# Patient Record
Sex: Female | Born: 1945 | Race: White | Hispanic: No | Smoking: Current every day smoker
Health system: Southern US, Community
[De-identification: ages and names within clinical notes are randomized; demographics above are authoritative.]

## PROBLEM LIST (undated history)

## (undated) DIAGNOSIS — F419 Anxiety disorder, unspecified: Secondary | ICD-10-CM

## (undated) DIAGNOSIS — M25551 Pain in right hip: Secondary | ICD-10-CM

## (undated) DIAGNOSIS — M1990 Unspecified osteoarthritis, unspecified site: Secondary | ICD-10-CM

## (undated) DIAGNOSIS — K279 Peptic ulcer, site unspecified, unspecified as acute or chronic, without hemorrhage or perforation: Secondary | ICD-10-CM

## (undated) DIAGNOSIS — R0902 Hypoxemia: Secondary | ICD-10-CM

## (undated) DIAGNOSIS — M25539 Pain in unspecified wrist: Secondary | ICD-10-CM

## (undated) DIAGNOSIS — IMO0002 Reserved for concepts with insufficient information to code with codable children: Secondary | ICD-10-CM

## (undated) DIAGNOSIS — M25552 Pain in left hip: Secondary | ICD-10-CM

## (undated) DIAGNOSIS — I10 Essential (primary) hypertension: Secondary | ICD-10-CM

## (undated) DIAGNOSIS — J449 Chronic obstructive pulmonary disease, unspecified: Secondary | ICD-10-CM

## (undated) DIAGNOSIS — G894 Chronic pain syndrome: Secondary | ICD-10-CM

## (undated) DIAGNOSIS — Z9981 Dependence on supplemental oxygen: Secondary | ICD-10-CM

## (undated) DIAGNOSIS — M549 Dorsalgia, unspecified: Secondary | ICD-10-CM

## (undated) DIAGNOSIS — M810 Age-related osteoporosis without current pathological fracture: Secondary | ICD-10-CM

---

## 2006-05-30 ENCOUNTER — Emergency Department (HOSPITAL_COMMUNITY): Admission: EM | Admit: 2006-05-30 | Discharge: 2006-05-30 | Payer: Self-pay | Admitting: Emergency Medicine

## 2006-06-07 ENCOUNTER — Emergency Department (HOSPITAL_COMMUNITY): Admission: EM | Admit: 2006-06-07 | Discharge: 2006-06-07 | Payer: Self-pay | Admitting: Emergency Medicine

## 2007-02-12 ENCOUNTER — Emergency Department (HOSPITAL_COMMUNITY): Admission: EM | Admit: 2007-02-12 | Discharge: 2007-02-12 | Payer: Self-pay | Admitting: Emergency Medicine

## 2007-03-18 ENCOUNTER — Ambulatory Visit: Payer: Self-pay | Admitting: Gastroenterology

## 2009-04-13 ENCOUNTER — Encounter: Payer: Self-pay | Admitting: Family Medicine

## 2010-06-30 ENCOUNTER — Encounter: Payer: Self-pay | Admitting: Family Medicine

## 2010-10-23 NOTE — Consult Note (Signed)
Brittney West, Brittney West             ACCOUNT NO.:  [id number]   MEDICAL RECORD NO.:  [id number]          PATIENT TYPE:  AMB   LOCATION:  DAY                           FACILITY:  [facility]   PHYSICIAN:  Kassie Mends, M.D.      DATE OF BIRTH:  05/14/1946   DATE OF CONSULTATION:  03/18/2007  DATE OF DISCHARGE:                                 CONSULTATION   REASON FOR CONSULTATION:  Hemoccult positive stool, GERD.   HISTORY OF PRESENT ILLNESS:  The patient is a 65 year old female patient  of Dr. Malvin Johns who presents for further evaluation of Hemoccult  positive stools and GERD.  The patient has been having some right upper  quadrant abdominal discomfort associated with heartburn and intermittent  nausea, vomiting.  Brittney West has been to the emergency department twice in  September for these symptoms as well as some back pain issues.  Brittney West had  an abdominal ultrasound which revealed a small gallbladder polyp.  Her  white count was elevated at 11,200, hemoglobin 16.1.  Brittney West is a smoker.  Platelet count 243,000.  LFTs were normal.  Her lipase was normal as  well.  Brittney West gives a history of bleeding peptic ulcer disease in the  remote past.  It does not sound like Brittney West has ever had an endoscopy,  however.  Brittney West is on Nexium 40 mg daily.  Brittney West has been taking Goody's  powders 2 times a day for arthritic pain.  Brittney West has since stopped this at  Dr. Daisy Blossom request.  Brittney West denies any melena or bright red blood per  rectum, dysphagia, odynophagia.  Brittney West has chronic constipation.  Brittney West has  typical GERD symptoms.  Brittney West has an intentional weight loss of over 50  pounds chronically that Brittney West has done with dietary changes.  Brittney West has  never had a colonoscopy.   CURRENT MEDICATIONS:  1. Nexium 40 mg daily.  2. OTC sinus medications.  3. Goody's powders as above but recently discontinued.   ALLERGIES:  1. STADOL.  2. TORADOL.  3. CODEINE IN LARGE AMOUNTS.  4. TYLENOL IN LARGE AMOUNTS.  5. Brittney West STATES Brittney West HAS HAD DIFFICULTY  WITH ANESTHESIA IN THE PAST,      STATING THAT Brittney West HAS NOT HAD ADEQUATE SEDATION, BUT Brittney West CANNOT      RECALL ANY PARTICULAR MEDICATIONS Brittney West HAS RECEIVED.  HER MOST      RECENT SURGERY ON HER RIGHT HAND Brittney West DID FINE, HOWEVER.   PAST MEDICAL HISTORY:  1. Asthma.  2. GERD.  3. Osteoporosis.  4. Osteoarthritis.  5. Rheumatoid arthritis.  6. Degenerative disc disease.  7. History of gallbladder polyp.   PAST SURGICAL HISTORY:  1. Brittney West had a hernia repair as a child.  2. Partial hysterectomy in 1978.  3. Left-hand carpal tunnel release in 1995.  4. 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  5. Brittney West had surgery on her right-hand finger twice and surgery on her      left foot.   FAMILY HISTORY:  Mother deceased at age 9 with suicide.  Father  deceased at age 39 of MI.  No  family history of colorectal cancer.  Brittney West  lost a daughter to a house fire at age 51 and another daughter died in  her sleep at age 54 apparently from an undiagnosed cardiac abnormality.   SOCIAL HISTORY:  Brittney West is married.  Brittney West has 1 son and 2 deceased  daughters.  Brittney West is on disability. Brittney West is a smoker.  Smokes 1 pack of  cigarettes daily.  No alcohol use.   REVIEW OF SYSTEMS:  GI:  Brittney West HPI for GI.  CONSTITUTIONAL:  See HPI.  CARDIOPULMONARY:  Brittney West has intermittent congestion and cough.  Denies  chest pain, palpitations.   PHYSICAL EXAMINATION:  VITAL SIGNS: Weight 127, height 5 feet 4 inches,  temp 98.5, blood pressure 112/78, pulse 80.  GENERAL:  Pleasant, thin female in no acute distress.  SKIN:  Warm and dry.  No jaundice.  HEENT: Sclerae nonicteric.  Oropharyngeal mucosa moist and pink.  No  lesions, erythema or exudate.  NECK:  No lymphadenopathy, thyromegaly.  CHEST:  Lungs clear to auscultation.  CARDIAC: Exam reveals regular rate and rhythm.  Normal S1, S2.  No  murmurs, rubs or gallops.  ABDOMEN:  Positive bowel sounds.  Abdomen is soft, nondistended,  nontender.  No organomegaly or masses.  No rebound  tenderness or  guarding.  No abdominal bruits or hernias.  EXTREMITIES:  No edema.   IMPRESSION:  Brittney West is a 65 year old lady with right upper quadrant  abdominal discomfort and recently diagnosed with a gallbladder polyp.  Brittney West also has typical GERD symptoms and chronic constipation and recently  stool was Hemoccult positive on digital rectal exam by Dr. Malvin Johns.  Brittney West has a history of chronic aspirin use as outlined above.  Hemoccult  positive stools may be secondary to gastrointestinal erosion or  irritation from nonsteroidal anti-inflammatories/aspirin.  However, we  need to exclude erosive reflux esophagitis, peptic ulcer disease or  colorectal polyp.   PLAN:  EGD and colonoscopy with Dr. Cira Servant in the near future.  The  patient has concerns regarding obtaining adequate sedation.  Brittney West is not  on any psychotropic medications.  Brittney West does not recall having any  specific allergies to anesthesia.   I would like to thank Dr. Malvin Johns for allowing [place] to take part in the  care of this patient.      Tana Coast, P.A.      Kassie Mends, M.D.  Electronically Signed    LL/MEDQ  D:  03/18/2007  T:  03/19/2007  Job:  [id number]   cc:   Barbaraann Barthel, M.D.  Fax: [phone]

## 2013-05-24 ENCOUNTER — Emergency Department (HOSPITAL_COMMUNITY): Payer: Medicare Other

## 2013-05-24 ENCOUNTER — Inpatient Hospital Stay (HOSPITAL_COMMUNITY)
Admission: EM | Admit: 2013-05-24 | Discharge: 2013-05-31 | DRG: 208 | Disposition: A | Discharge status: Skilled Nursing Facility | Payer: Medicare Other | Attending: Internal Medicine | Admitting: Internal Medicine

## 2013-05-24 ENCOUNTER — Encounter (HOSPITAL_COMMUNITY): Payer: Self-pay | Admitting: Emergency Medicine

## 2013-05-24 DIAGNOSIS — T502X5A Adverse effect of carbonic-anhydrase inhibitors, benzothiadiazides and other diuretics, initial encounter: Secondary | ICD-10-CM | POA: Diagnosis present

## 2013-05-24 DIAGNOSIS — F17200 Nicotine dependence, unspecified, uncomplicated: Secondary | ICD-10-CM | POA: Diagnosis present

## 2013-05-24 DIAGNOSIS — R0609 Other forms of dyspnea: Secondary | ICD-10-CM

## 2013-05-24 DIAGNOSIS — J1100 Influenza due to unidentified influenza virus with unspecified type of pneumonia: Secondary | ICD-10-CM | POA: Diagnosis present

## 2013-05-24 DIAGNOSIS — R0600 Dyspnea, unspecified: Secondary | ICD-10-CM | POA: Diagnosis present

## 2013-05-24 DIAGNOSIS — F411 Generalized anxiety disorder: Secondary | ICD-10-CM | POA: Diagnosis present

## 2013-05-24 DIAGNOSIS — I5031 Acute diastolic (congestive) heart failure: Secondary | ICD-10-CM | POA: Diagnosis present

## 2013-05-24 DIAGNOSIS — Z9981 Dependence on supplemental oxygen: Secondary | ICD-10-CM

## 2013-05-24 DIAGNOSIS — J441 Chronic obstructive pulmonary disease with (acute) exacerbation: Secondary | ICD-10-CM | POA: Diagnosis present

## 2013-05-24 DIAGNOSIS — J189 Pneumonia, unspecified organism: Secondary | ICD-10-CM | POA: Diagnosis present

## 2013-05-24 DIAGNOSIS — E872 Acidosis, unspecified: Secondary | ICD-10-CM | POA: Diagnosis present

## 2013-05-24 DIAGNOSIS — J9620 Acute and chronic respiratory failure, unspecified whether with hypoxia or hypercapnia: Principal | ICD-10-CM | POA: Diagnosis present

## 2013-05-24 DIAGNOSIS — G9340 Encephalopathy, unspecified: Secondary | ICD-10-CM | POA: Diagnosis present

## 2013-05-24 DIAGNOSIS — IMO0002 Reserved for concepts with insufficient information to code with codable children: Secondary | ICD-10-CM | POA: Diagnosis present

## 2013-05-24 DIAGNOSIS — I10 Essential (primary) hypertension: Secondary | ICD-10-CM | POA: Diagnosis present

## 2013-05-24 DIAGNOSIS — I129 Hypertensive chronic kidney disease with stage 1 through stage 4 chronic kidney disease, or unspecified chronic kidney disease: Secondary | ICD-10-CM | POA: Diagnosis present

## 2013-05-24 DIAGNOSIS — R197 Diarrhea, unspecified: Secondary | ICD-10-CM | POA: Diagnosis present

## 2013-05-24 DIAGNOSIS — J449 Chronic obstructive pulmonary disease, unspecified: Secondary | ICD-10-CM | POA: Diagnosis present

## 2013-05-24 DIAGNOSIS — I509 Heart failure, unspecified: Secondary | ICD-10-CM | POA: Diagnosis present

## 2013-05-24 DIAGNOSIS — M1990 Unspecified osteoarthritis, unspecified site: Secondary | ICD-10-CM | POA: Diagnosis present

## 2013-05-24 DIAGNOSIS — F419 Anxiety disorder, unspecified: Secondary | ICD-10-CM

## 2013-05-24 DIAGNOSIS — Z79899 Other long term (current) drug therapy: Secondary | ICD-10-CM

## 2013-05-24 DIAGNOSIS — M810 Age-related osteoporosis without current pathological fracture: Secondary | ICD-10-CM | POA: Diagnosis present

## 2013-05-24 DIAGNOSIS — Z23 Encounter for immunization: Secondary | ICD-10-CM

## 2013-05-24 DIAGNOSIS — G894 Chronic pain syndrome: Secondary | ICD-10-CM | POA: Diagnosis present

## 2013-05-24 DIAGNOSIS — N189 Chronic kidney disease, unspecified: Secondary | ICD-10-CM | POA: Diagnosis present

## 2013-05-24 DIAGNOSIS — N390 Urinary tract infection, site not specified: Secondary | ICD-10-CM | POA: Diagnosis present

## 2013-05-24 DIAGNOSIS — R1110 Vomiting, unspecified: Secondary | ICD-10-CM | POA: Diagnosis present

## 2013-05-24 HISTORY — DX: Pain in unspecified wrist: M25.539

## 2013-05-24 HISTORY — DX: Dependence on supplemental oxygen: Z99.81

## 2013-05-24 HISTORY — DX: Age-related osteoporosis without current pathological fracture: M81.0

## 2013-05-24 HISTORY — DX: Unspecified osteoarthritis, unspecified site: M19.90

## 2013-05-24 HISTORY — DX: Hypoxemia: R09.02

## 2013-05-24 HISTORY — DX: Dorsalgia, unspecified: M54.9

## 2013-05-24 HISTORY — DX: Peptic ulcer, site unspecified, unspecified as acute or chronic, without hemorrhage or perforation: K27.9

## 2013-05-24 HISTORY — DX: Pain in right hip: M25.551

## 2013-05-24 HISTORY — DX: Essential (primary) hypertension: I10

## 2013-05-24 HISTORY — DX: Pain in left hip: M25.552

## 2013-05-24 HISTORY — DX: Anxiety disorder, unspecified: F41.9

## 2013-05-24 HISTORY — DX: Chronic pain syndrome: G89.4

## 2013-05-24 HISTORY — DX: Chronic obstructive pulmonary disease, unspecified: J44.9

## 2013-05-24 HISTORY — DX: Reserved for concepts with insufficient information to code with codable children: IMO0002

## 2013-05-24 LAB — URINALYSIS, ROUTINE W REFLEX MICROSCOPIC
Bilirubin Urine: NEGATIVE
Ketones, ur: 15 mg/dL — AB
Leukocytes, UA: NEGATIVE
Nitrite: POSITIVE — AB
Urobilinogen, UA: 0.2 mg/dL (ref 0.0–1.0)
pH: 6 (ref 5.0–8.0)

## 2013-05-24 LAB — TROPONIN I
Troponin I: 0.43 ng/mL (ref <0.30)
Troponin I: <0.3 ng/mL (ref <0.30)

## 2013-05-24 LAB — BLOOD GAS, ARTERIAL
Acid-Base Excess: 1.8 mmol/L (ref 0.0–2.0)
Bicarbonate: 26.4 mEq/L — ABNORMAL HIGH (ref 20.0–24.0)
Bicarbonate: 26.5 mEq/L — ABNORMAL HIGH (ref 20.0–24.0)
MECHVT: 500 mL
Patient temperature: 37
TCO2: 25.2 mmol/L (ref 0–100)
TCO2: 23.9 mmol/L (ref 0–100)
pCO2 arterial: 77.5 mmHg (ref 35.0–45.0)
pCO2 arterial: 46.9 mmHg — ABNORMAL HIGH (ref 35.0–45.0)
pH, Arterial: 7.159 — CL (ref 7.350–7.450)
pH, Arterial: 7.371 (ref 7.350–7.450)
pO2, Arterial: 167 mmHg — ABNORMAL HIGH (ref 80.0–100.0)

## 2013-05-24 LAB — INFLUENZA PANEL BY PCR (TYPE A & B)
H1N1 flu by pcr: NOT DETECTED
Influenza A By PCR: NEGATIVE
Influenza B By PCR: NEGATIVE

## 2013-05-24 LAB — BASIC METABOLIC PANEL
CO2: 29 mEq/L (ref 19–32)
Calcium: 9.6 mg/dL (ref 8.4–10.5)
Chloride: 93 mEq/L — ABNORMAL LOW (ref 96–112)
Creatinine, Ser: 0.77 mg/dL (ref 0.50–1.10)
GFR calc Af Amer: >90 mL/min (ref >90)
Glucose, Bld: 140 mg/dL — ABNORMAL HIGH (ref 70–99)
Sodium: 137 mEq/L (ref 135–145)

## 2013-05-24 LAB — LACTIC ACID, PLASMA: Lactic Acid, Venous: 1.4 mmol/L (ref 0.5–2.2)

## 2013-05-24 LAB — PRO B NATRIURETIC PEPTIDE: Pro B Natriuretic peptide (BNP): 5934 pg/mL — ABNORMAL HIGH (ref 0–125)

## 2013-05-24 LAB — RAPID URINE DRUG SCREEN, HOSP PERFORMED
Amphetamines: NOT DETECTED
Benzodiazepines: POSITIVE — AB
Opiates: NOT DETECTED

## 2013-05-24 LAB — URINE MICROSCOPIC-ADD ON

## 2013-05-24 LAB — CBC
MCV: 94.7 fL (ref 78.0–100.0)
Platelets: 292 10*3/uL (ref 150–400)
RBC: 4.38 MIL/uL (ref 3.87–5.11)
WBC: 14 10*3/uL — ABNORMAL HIGH (ref 4.0–10.5)

## 2013-05-24 LAB — GLUCOSE, CAPILLARY: Glucose-Capillary: 133 mg/dL — ABNORMAL HIGH (ref 70–99)

## 2013-05-24 LAB — HEPATIC FUNCTION PANEL
ALT: 15 U/L (ref 0–35)
AST: 23 U/L (ref 0–37)
Indirect Bilirubin: 0.2 mg/dL — ABNORMAL LOW (ref 0.3–0.9)
Total Protein: 8 g/dL (ref 6.0–8.3)

## 2013-05-24 LAB — ETHANOL: Alcohol, Ethyl (B): <11 mg/dL (ref 0–11)

## 2013-05-24 MED ORDER — VANCOMYCIN HCL IN DEXTROSE 1-5 GM/200ML-% IV SOLN
1000.0000 mg | Freq: Once | INTRAVENOUS | Status: AC
  Administered 2013-05-24: 1000 mg via INTRAVENOUS
  Filled 2013-05-24: qty 200

## 2013-05-24 MED ORDER — PIPERACILLIN-TAZOBACTAM 3.375 G IVPB
INTRAVENOUS | Status: AC
  Filled 2013-05-24: qty 50

## 2013-05-24 MED ORDER — SODIUM CHLORIDE 0.9 % IV SOLN: INTRAVENOUS | Status: DC

## 2013-05-24 MED ORDER — FENTANYL CITRATE 0.05 MG/ML IJ SOLN: 50.0000 ug | INTRAMUSCULAR | Status: DC | PRN

## 2013-05-24 MED ORDER — SODIUM CHLORIDE 0.9 % IV SOLN
0.0000 ug/h | INTRAVENOUS | Status: DC
  Administered 2013-05-24: 50 ug/h via INTRAVENOUS
  Filled 2013-05-24 (×2): qty 50

## 2013-05-24 MED ORDER — CHLORHEXIDINE GLUCONATE 0.12 % MT SOLN
15.0000 mL | Freq: Two times a day (BID) | OROMUCOSAL | Status: DC
  Administered 2013-05-24 – 2013-05-29 (×9): 15 mL via OROMUCOSAL
  Filled 2013-05-24 (×14): qty 15

## 2013-05-24 MED ORDER — BIOTENE DRY MOUTH MT LIQD: 15.0000 mL | Freq: Two times a day (BID) | OROMUCOSAL | Status: DC

## 2013-05-24 MED ORDER — BIOTENE DRY MOUTH MT LIQD
15.0000 mL | Freq: Four times a day (QID) | OROMUCOSAL | Status: DC
  Administered 2013-05-25 – 2013-05-30 (×16): 15 mL via OROMUCOSAL

## 2013-05-24 MED ORDER — METOPROLOL SUCCINATE ER 50 MG PO TB24: 50.0000 mg | ORAL_TABLET | Freq: Every day | ORAL | Status: DC

## 2013-05-24 MED ORDER — PANTOPRAZOLE SODIUM 40 MG IV SOLR
40.0000 mg | Freq: Every day | INTRAVENOUS | Status: DC
  Administered 2013-05-24 – 2013-05-27 (×4): 40 mg via INTRAVENOUS
  Filled 2013-05-24 (×4): qty 40

## 2013-05-24 MED ORDER — VANCOMYCIN HCL IN DEXTROSE 1-5 GM/200ML-% IV SOLN
1000.0000 mg | INTRAVENOUS | Status: DC
  Administered 2013-05-25 – 2013-05-27 (×3): 1000 mg via INTRAVENOUS
  Filled 2013-05-24 (×4): qty 200

## 2013-05-24 MED ORDER — ALBUTEROL SULFATE (5 MG/ML) 0.5% IN NEBU
5.0000 mg | INHALATION_SOLUTION | Freq: Once | RESPIRATORY_TRACT | Status: AC
  Administered 2013-05-24: 5 mg via RESPIRATORY_TRACT

## 2013-05-24 MED ORDER — SODIUM CHLORIDE 0.9 % IV SOLN
INTRAVENOUS | Status: AC
  Administered 2013-05-24: 650 mL via INTRAVENOUS

## 2013-05-24 MED ORDER — CHLORHEXIDINE GLUCONATE 0.12 % MT SOLN: 15.0000 mL | Freq: Two times a day (BID) | OROMUCOSAL | Status: DC

## 2013-05-24 MED ORDER — BIOTENE DRY MOUTH MT LIQD
15.0000 mL | Freq: Four times a day (QID) | OROMUCOSAL | Status: DC
  Administered 2013-05-25 – 2013-05-26 (×3): 15 mL via OROMUCOSAL

## 2013-05-24 MED ORDER — SODIUM CHLORIDE 0.9 % IV SOLN
10.0000 ug/h | INTRAVENOUS | Status: DC
  Filled 2013-05-24: qty 50

## 2013-05-24 MED ORDER — VANCOMYCIN HCL IN DEXTROSE 1-5 GM/200ML-% IV SOLN
INTRAVENOUS | Status: AC
  Filled 2013-05-24: qty 200

## 2013-05-24 MED ORDER — PIPERACILLIN-TAZOBACTAM 3.375 G IVPB
3.3750 g | Freq: Three times a day (TID) | INTRAVENOUS | Status: DC
  Administered 2013-05-25 – 2013-05-28 (×11): 3.375 g via INTRAVENOUS
  Filled 2013-05-24 (×11): qty 50

## 2013-05-24 MED ORDER — FENTANYL CITRATE 0.05 MG/ML IJ SOLN: 50.0000 ug | Freq: Once | INTRAMUSCULAR | Status: DC

## 2013-05-24 MED ORDER — ONDANSETRON HCL 4 MG/2ML IJ SOLN
4.0000 mg | Freq: Once | INTRAMUSCULAR | Status: AC
  Administered 2013-05-24: 4 mg via INTRAVENOUS
  Filled 2013-05-24: qty 2

## 2013-05-24 MED ORDER — PANTOPRAZOLE SODIUM 40 MG IV SOLR: 40.0000 mg | Freq: Every day | INTRAVENOUS | Status: DC

## 2013-05-24 MED ORDER — FENTANYL CITRATE 0.05 MG/ML IJ SOLN
INTRAMUSCULAR | Status: AC
  Filled 2013-05-24: qty 50

## 2013-05-24 MED ORDER — ROCURONIUM BROMIDE 50 MG/5ML IV SOLN
1.0000 mg/kg | Freq: Once | INTRAVENOUS | Status: AC
  Administered 2013-05-24: 57.6 mg via INTRAVENOUS
  Filled 2013-05-24: qty 5.76

## 2013-05-24 MED ORDER — SODIUM CHLORIDE 0.9 % IV SOLN
1000.0000 mL | Freq: Once | INTRAVENOUS | Status: AC
  Administered 2013-05-24: 1000 mL via INTRAVENOUS

## 2013-05-24 MED ORDER — PIPERACILLIN-TAZOBACTAM 3.375 G IVPB
3.3750 g | Freq: Once | INTRAVENOUS | Status: AC
  Administered 2013-05-24: 3.375 g via INTRAVENOUS
  Filled 2013-05-24: qty 50

## 2013-05-24 MED ORDER — IPRATROPIUM BROMIDE 0.02 % IN SOLN
0.5000 mg | Freq: Once | RESPIRATORY_TRACT | Status: AC
  Administered 2013-05-24: 0.5 mg via RESPIRATORY_TRACT

## 2013-05-24 MED ORDER — PROPOFOL 10 MG/ML IV EMUL
5.0000 ug/kg/min | Freq: Once | INTRAVENOUS | Status: AC
  Administered 2013-05-24: 5 ug/kg/min via INTRAVENOUS
  Filled 2013-05-24: qty 100

## 2013-05-24 MED ORDER — ENOXAPARIN SODIUM 40 MG/0.4ML ~~LOC~~ SOLN
40.0000 mg | SUBCUTANEOUS | Status: DC
Start: 2013-05-24 — End: 2013-05-31
  Administered 2013-05-24 – 2013-05-30 (×7): 40 mg via SUBCUTANEOUS
  Filled 2013-05-24 (×7): qty 0.4

## 2013-05-24 MED ORDER — ALBUTEROL SULFATE (5 MG/ML) 0.5% IN NEBU
2.5000 mg | INHALATION_SOLUTION | Freq: Four times a day (QID) | RESPIRATORY_TRACT | Status: DC
  Administered 2013-05-24 – 2013-05-26 (×8): 2.5 mg via RESPIRATORY_TRACT
  Filled 2013-05-24 (×8): qty 0.5

## 2013-05-24 MED ORDER — ETOMIDATE 2 MG/ML IV SOLN
0.3000 mg/kg | Freq: Once | INTRAVENOUS | Status: AC
  Administered 2013-05-24: 17.28 mg via INTRAVENOUS

## 2013-05-24 MED ORDER — IPRATROPIUM BROMIDE 0.02 % IN SOLN
0.5000 mg | Freq: Four times a day (QID) | RESPIRATORY_TRACT | Status: DC
  Administered 2013-05-24 – 2013-05-26 (×8): 0.5 mg via RESPIRATORY_TRACT
  Filled 2013-05-24 (×8): qty 2.5

## 2013-05-24 MED ORDER — FENTANYL BOLUS VIA INFUSION
25.0000 ug | INTRAVENOUS | Status: DC | PRN
  Filled 2013-05-24: qty 50

## 2013-05-24 NOTE — ED Notes (Signed)
Lab called gave critical Troponin 0.43.  Notified Dr Rubin Payor.

## 2013-05-24 NOTE — ED Notes (Signed)
Pt with cough, gen. Weakness, emesis

## 2013-05-24 NOTE — Plan of Care (Signed)
Problem: Consults Goal: Diabetes Guidelines if Diabetic/Glucose > 140 If diabetic or lab glucose is > 140 mg/dl - Initiate Diabetes/Hyperglycemia Guidelines & Document Interventions  Outcome: Not Applicable Date Met:  05/24/13 Blood sugar 100 on admission to floor  Problem: Phase I Progression Outcomes Goal: VTE prophylaxis Outcome: Completed/Met Date Met:  05/24/13 Lovenox Goal: GIProphysixis Outcome: Completed/Met Date Met:  05/24/13 protonix Goal: Oral Care per Protocol Outcome: Completed/Met Date Met:  05/24/13 Q4 hrs Goal: Pneumonia/flu vaccination screen completed Outcome: Progressing No family present to complete at present Goal: Code status addressed with pt/family Outcome: Progressing Patient is a full code Goal: Pain controlled with appropriate interventions Outcome: Progressing Fentanyl gtt for sedation and chronic pain issues Goal: Initial discharge plan identified Outcome: Progressing Lives with cousin at present

## 2013-05-24 NOTE — ED Provider Notes (Signed)
CSN: [id number]     Arrival date & time 05/24/13  1156 History   This chart was scribed for [employer]. Rubin Payor, MD, by Yevette Edwards, ED Scribe. This patient was seen in room [facility] and the patient's care was started at 1:18 PM. First MD Initiated Contact with Patient 05/24/13 1304     Chief Complaint  Patient presents with  . Emesis  . Weakness  . Cough  . Respiratory Distress   Level Five Caveat: AMS  The history is provided by the patient and medical records. The history is limited by the condition of the patient. No language interpreter was used.    HPI Comments: Brittney West is a 67 y.o. female who presents to the Emergency Department complaining of generalized weakness which began last week. She has experienced emesis, diarrhea, and a cough.   Per nursing staff, the pt has a h/o COPD and is dependent upon oxygen she does not know the L/min rate.   Past Medical History  Diagnosis Date  . Hypertension   . COPD (chronic obstructive pulmonary disease)   . Oxygen dependent   . Hypoxia   . Peptic ulcer   . DDD (degenerative disc disease)   . Osteoporosis   . Osteoarthritis   . Chronic pain syndrome   . Back pain   . Hip pain, bilateral   . Wrist pain   . Anxiety    History reviewed. No pertinent past surgical history. History reviewed. No pertinent family history. History  Substance Use Topics  . Smoking status: Current Every Day Smoker    Types: Cigarettes  . Smokeless tobacco: Not on file  . Alcohol Use: No   No OB history provided.  Review of Systems  Unable to perform ROS: Mental status change    Allergies  Advair diskus; Asa; Codeine; Nsaids; Other; Stadol; and Xanax  Home Medications  No current outpatient prescriptions on file.  Triage Vitals: BP 96/67  Pulse 116  Temp(Src) 99.6 F (37.6 C) (Oral)  Resp 20  SpO2 91%  Physical Exam  Nursing note and vitals reviewed. Constitutional:  Somewhat pale.   HENT:  Head: Normocephalic and  atraumatic.  Eyes: EOM are normal.  Pupils are 3 mm and somewhat constricted.   Neck: Neck supple. No tracheal deviation present.  Cardiovascular: Normal rate, regular rhythm, normal heart sounds and intact distal pulses.   No murmur heard. Pulmonary/Chest: Effort normal. No respiratory distress. She has rales.  Diffuse rales bilaterally.  Abdominal: There is tenderness.  Diffusely tender.   Musculoskeletal: Normal range of motion. She exhibits no edema.  No peripheral edema.  Neurological: She is alert.  Skin: Skin is warm and dry. She is not diaphoretic.  Psychiatric:  Responds occasionally, but mainly moans.     ED Course  Procedures (including critical care time)   COORDINATION OF CARE:  1:25 PM- Discussed treatment plan with patient, and the patient agreed to the plan.   1:34 PM- Rechecked pt whose stats had dropped to 30%.   1:52 PM- Rechecked pt. Stats improved.   Results for orders placed during the hospital encounter of 05/24/13  MRSA PCR SCREENING      Result Value Range   MRSA by PCR NEGATIVE  NEGATIVE  CULTURE, BLOOD (ROUTINE X 2)      Result Value Range   Specimen Description BLOOD RIGHT ARM     Special Requests BOTTLES DRAWN AEROBIC AND ANAEROBIC 6CC BOTTLES     Culture PENDING  Report Status PENDING    CULTURE, BLOOD (ROUTINE X 2)      Result Value Range   Specimen Description BLOOD RIGHT HAND     Special Requests BOTTLES DRAWN AEROBIC AND ANAEROBIC 6CC BOTTLES     Culture PENDING     Report Status PENDING    CBC      Result Value Range   WBC 14.0 (*) 4.0 - 10.5 K/uL   RBC 4.38  3.87 - 5.11 MIL/uL   Hemoglobin 13.7  12.0 - 15.0 g/dL   HCT 78.2  95.6 - 21.3 %   MCV 94.7  78.0 - 100.0 fL   MCH 31.3  26.0 - 34.0 pg   MCHC 33.0  30.0 - 36.0 g/dL   RDW 08.6  57.8 - 46.9 %   Platelets 292  150 - 400 K/uL  BASIC METABOLIC PANEL      Result Value Range   Sodium 137  135 - 145 mEq/L   Potassium 4.2  3.5 - 5.1 mEq/L   Chloride 93 (*) 96 - 112 mEq/L    CO2 29  19 - 32 mEq/L   Glucose, Bld 140 (*) 70 - 99 mg/dL   BUN 21  6 - 23 mg/dL   Creatinine, Ser 6.29  0.50 - 1.10 mg/dL   Calcium 9.6  8.4 - 52.8 mg/dL   GFR calc non Af Amer 86 (*) >90 mL/min   GFR calc Af Amer >90  >90 mL/min  GLUCOSE, CAPILLARY      Result Value Range   Glucose-Capillary 133 (*) 70 - 99 mg/dL  HEPATIC FUNCTION PANEL      Result Value Range   Total Protein 8.0  6.0 - 8.3 g/dL   Albumin 3.4 (*) 3.5 - 5.2 g/dL   AST 23  0 - 37 U/L   ALT 15  0 - 35 U/L   Alkaline Phosphatase 166 (*) 39 - 117 U/L   Total Bilirubin 0.3  0.3 - 1.2 mg/dL   Bilirubin, Direct 0.1  0.0 - 0.3 mg/dL   Indirect Bilirubin 0.2 (*) 0.3 - 0.9 mg/dL  LIPASE, BLOOD      Result Value Range   Lipase 16  11 - 59 U/L  LACTIC ACID, PLASMA      Result Value Range   Lactic Acid, Venous 1.4  0.5 - 2.2 mmol/L  ETHANOL      Result Value Range   Alcohol, Ethyl (B) <11  0 - 11 mg/dL  URINE RAPID DRUG SCREEN (HOSP PERFORMED)      Result Value Range   Opiates NONE DETECTED  NONE DETECTED   Cocaine NONE DETECTED  NONE DETECTED   Benzodiazepines POSITIVE (*) NONE DETECTED   Amphetamines NONE DETECTED  NONE DETECTED   Tetrahydrocannabinol NONE DETECTED  NONE DETECTED   Barbiturates NONE DETECTED  NONE DETECTED  TROPONIN I      Result Value Range   Troponin I 0.43 (*) <0.30 ng/mL  PRO B NATRIURETIC PEPTIDE      Result Value Range   Pro B Natriuretic peptide (BNP) 5934.0 (*) 0 - 125 pg/mL  BLOOD GAS, ARTERIAL      Result Value Range   FIO2 80.00     Delivery systems NON-REBREATHER OXYGEN MASK     pH, Arterial 7.159 (*) 7.350 - 7.450   pCO2 arterial 77.5 (*) 35.0 - 45.0 mmHg   pO2, Arterial 162.0 (*) 80.0 - 100.0 mmHg   Bicarbonate 26.4 (*) 20.0 - 24.0 mEq/L  TCO2 25.2  0 - 100 mmol/L   Acid-base deficit 1.4  0.0 - 2.0 mmol/L   O2 Saturation 97.8     Patient temperature 37.0     Collection site RIGHT RADIAL     Drawn by [id number]     Sample type ARTERIAL     Allens test (pass/fail) PASS   PASS  URINALYSIS, ROUTINE W REFLEX MICROSCOPIC      Result Value Range   Color, Urine YELLOW  YELLOW   APPearance CLOUDY (*) CLEAR   Specific Gravity, Urine >1.030 (*) 1.005 - 1.030   pH 6.0  5.0 - 8.0   Glucose, UA NEGATIVE  NEGATIVE mg/dL   Hgb urine dipstick LARGE (*) NEGATIVE   Bilirubin Urine NEGATIVE  NEGATIVE   Ketones, ur 15 (*) NEGATIVE mg/dL   Protein, ur 147 (*) NEGATIVE mg/dL   Urobilinogen, UA 0.2  0.0 - 1.0 mg/dL   Nitrite POSITIVE (*) NEGATIVE   Leukocytes, UA NEGATIVE  NEGATIVE  TROPONIN I      Result Value Range   Troponin I <0.30  <0.30 ng/mL  BLOOD GAS, ARTERIAL      Result Value Range   FIO2 60.00     Delivery systems VENTILATOR     Mode PRESSURE REGULATED VOLUME CONTROL     VT 500     Rate 15     Peep/cpap 5.0     pH, Arterial 7.371  7.350 - 7.450   pCO2 arterial 46.9 (*) 35.0 - 45.0 mmHg   pO2, Arterial 167.0 (*) 80.0 - 100.0 mmHg   Bicarbonate 26.5 (*) 20.0 - 24.0 mEq/L   TCO2 23.9  0 - 100 mmol/L   Acid-Base Excess 1.8  0.0 - 2.0 mmol/L   O2 Saturation 98.8     Patient temperature 37.0     Collection site RIGHT BRACHIAL     Drawn by [id number]     Sample type ARTERIAL     Allens test (pass/fail) PASS  PASS  URINE MICROSCOPIC-ADD ON      Result Value Range   WBC, UA 0-2  <3 WBC/hpf   RBC / HPF 7-10  <3 RBC/hpf   Bacteria, UA MANY (*) RARE   Casts GRANULAR CAST (*) NEGATIVE  TROPONIN I      Result Value Range   Troponin I <0.30  <0.30 ng/mL  GLUCOSE, CAPILLARY      Result Value Range   Glucose-Capillary 100 (*) 70 - 99 mg/dL   Comment 1 Notify RN     Comment 2 Documented in Chart     Dg Chest Port 1 View  05/24/2013   CLINICAL DATA:  Altered mental status, hypoxia.  EXAM: PORTABLE CHEST - 1 VIEW  COMPARISON:  10/2011.  FINDINGS: Trachea is midline. Heart size normal. Mild diffuse interstitial prominence and indistinctness. Probable tiny bilateral effusions.  IMPRESSION: Question mild edema and tiny bilateral effusions.   Electronically Signed    By: Leanna Battles M.D.   On: 05/24/2013 14:00    EKG Interpretation    Date/Time:    Ventricular Rate:    PR Interval:    QRS Duration:   QT Interval:    QTC Calculation:   R Axis:     Text Interpretation:              MDM   1. Respiratory failure, acute-on-chronic   2. Anxiety   3. Chronic pain syndrome   4. COPD (chronic obstructive pulmonary disease)   5. COPD exacerbation  6. Dyspnea   7. Hypertension   8. PNA (pneumonia)    Patient with respiratory failure. Reportedly had oxygen at home but she has not been using. She continued to be altered and would desaturate. X-ray shows possible pulmonary edema, however patient did have a low-grade temperature also. We'll treat with antibiotics. Patient required intubation. PCO2 decreased after the intubation. Will be admitted to the ICU. Initial troponin was mildly elevated, however repeat was normal.  I personally performed the services described in this documentation, which was scribed in my presence. The recorded information has been reviewed and is accurate.  CRITICAL CARE Performed by: Billee Cashing Total critical care time: 30 Critical care time was exclusive of separately billable procedures and treating other patients. Critical care was necessary to treat or prevent imminent or life-threatening deterioration. Critical care was time spent personally by me on the following activities: development of treatment plan with patient and/or surrogate as well as nursing, discussions with consultants, evaluation of patient's response to treatment, examination of patient, obtaining history from patient or surrogate, ordering and performing treatments and interventions, ordering and review of laboratory studies, ordering and review of radiographic studies, pulse oximetry and re-evaluation of patient's condition.  INTUBATION Performed by: Billee Cashing  Required items: required blood products, implants, devices, and  special equipment available Patient identity confirmed: provided demographic data and hospital-assigned identification number Time out: Immediately prior to procedure a "time out" was called to verify the correct patient, procedure, equipment, support staff and site/side marked as required.  Indications: Respiratory failure   Intubation method: Glidescope Laryngoscopy   Preoxygenation: BVM  Sedatives: Etomidate Paralytic: Rocuronium   Tube Size: 7.5 cuffed  Post-procedure assessment: chest rise and ETCO2 monitor Breath sounds: equal and absent over the epigastrium Tube secured with: ETT holder Chest x-ray interpreted by radiologist and me.  Chest x-ray findings: endotracheal tube in appropriate position  Patient tolerated the procedure well with no immediate complications.     Juliet Rude. Rubin Payor, MD 05/24/13 2207

## 2013-05-24 NOTE — ED Notes (Signed)
Pt tubed with 7.5 22 lip right side.

## 2013-05-24 NOTE — H&P (Signed)
PCP:   Ernestine Conrad, MD   Chief Complaint:  sob  HPI: 67 yo female with crf due to copd comes into ED with sob and resp distress.  Pt had to be emergently intubated in ED.  History obtained from ED and icu staff.  Apparently pt recently lost her husband and was evicted from her apartment and was not able to take her oxygen with her.   She has been off her oxygen for about West week and is living with her cousin.  Upon suctioning in icu were copious amounts of thick productive secretions.    Review of Systems:  Unobtainable  Past Medical History: Past Medical History  Diagnosis Date  . Hypertension   . COPD (chronic obstructive pulmonary disease)   . Oxygen dependent   . Hypoxia   . Peptic ulcer   . DDD (degenerative disc disease)   . Osteoporosis   . Osteoarthritis   . Chronic pain syndrome   . Back pain   . Hip pain, bilateral   . Wrist pain   . Anxiety    History reviewed. No pertinent past surgical history.  Medications: Prior to Admission medications   Medication Sig Start Date End Date Taking? Authorizing Provider  amoxicillin (AMOXIL) 875 MG tablet Take 875 mg by mouth 3 (three) times daily. 10 day course stating on 05/10/2013 05/10/13   Historical Provider, MD  COMBIVENT RESPIMAT 20-100 MCG/ACT AERS respimat Inhale 1 puff into the lungs every 4 (four) hours as needed. For shortness of breath/wheezing 05/20/13   Historical Provider, MD  diazepam (VALIUM) 10 MG tablet Take 10 mg by mouth 3 (three) times daily as needed. For anxiety 05/10/13   Historical Provider, MD  ENDOCET 10-325 MG per tablet Take 1 tablet by mouth 4 (four) times daily as needed. For pain 05/20/13   Historical Provider, MD  gabapentin (NEURONTIN) 300 MG capsule Take 300 mg by mouth 3 (three) times daily. 05/10/13   Historical Provider, MD  metoprolol succinate (TOPROL-XL) 50 MG 24 hr tablet Take 50 mg by mouth daily. 05/16/13   Historical Provider, MD  omeprazole (PRILOSEC) 40 MG capsule Take 40 mg by mouth  daily. 05/10/13   Historical Provider, MD  ondansetron (ZOFRAN) 4 MG tablet Take 4 mg by mouth 3 (three) times daily as needed. For nausea and/or vomiting 05/12/13   Historical Provider, MD    Allergies:   Allergies  Allergen Reactions  . Advair Diskus [Fluticasone-Salmeterol]     Told by GI not to take.  [name] [Aspirin]     Due to GI problems  . Codeine     GI upset  . Nsaids     tigger migraine  . Other     PO/IV steroids--told by GI not to take.  . Stadol [Butorphanol]     Mental status changes  . Xanax [Alprazolam]     Told by GI not to take    Social History:  reports that she has been smoking Cigarettes.  She has been smoking about 0.00 packs per day. She does not have any smokeless tobacco history on file. She reports that she does not drink alcohol or use illicit drugs.  Family History: History reviewed. No pertinent family history.  Physical Exam: Filed Vitals:   05/24/13 1756 05/24/13 1853 05/24/13 2001 05/24/13 2005  BP: 126/71 114/58    Pulse: 79 87    Temp:  100.2 F (37.9 C)  99 F (37.2 C)  TempSrc:  Oral  Axillary  Resp:  18     Height:  5\' 4"  (1.626 m)    Weight:  50 kg (110 lb 3.7 oz)    SpO2: 100% 100% 99%    General appearance: no distress  Intubated, sedated, good color Head: Normocephalic, without obvious abnormality, atraumatic  ETT secure in good position Eyes: negative Nose: Nares normal. Septum midline. Mucosa normal. No drainage or sinus tenderness. Neck: no JVD and supple, symmetrical, trachea midline Lungs: diminished breath sounds bilaterally and rhonchi bibasilar also mild crackles bilaterally Heart: regular rate and rhythm, S1, S2 normal, no murmur, click, rub or gallop Abdomen: soft, non-tender; bowel sounds normal; no masses,  no organomegaly Extremities: extremities normal, atraumatic, no cyanosis or edema Pulses: 2+ and symmetric Skin: Skin color, texture, turgor normal. No rashes or lesions Neurologic: sedated and  intubated    Labs on Admission:   Recent Labs  05/24/13 1245  NA 137  K 4.2  CL 93*  CO2 29  GLUCOSE 140*  BUN 21  CREATININE 0.77  CALCIUM 9.6    Recent Labs  05/24/13 1247  AST 23  ALT 15  ALKPHOS 166*  BILITOT 0.3  PROT 8.0  ALBUMIN 3.4*    Recent Labs  05/24/13 1247  LIPASE 16    Recent Labs  05/24/13 1245  WBC 14.0*  HGB 13.7  HCT 41.5  MCV 94.7  PLT 292    Recent Labs  05/24/13 1247 05/24/13 1616  TROPONINI 0.43* <0.30    Radiological Exams on Admission: Dg Chest 1v Repeat Same Day  05/24/2013   CLINICAL DATA:  Intubation.  EXAM: CHEST - 1 VIEW SAME DAY  COMPARISON:  05/24/2013.  FINDINGS: Endotracheal tube noted with its tip approximately 3.9 cm above the carina. Previously identified mild pulmonary interstitial prominence is again noted. Small pleural effusions cannot be entirely excluded. These findings suggest interstitial edema. Pneumonitis cannot be excluded. Heart size and pulmonary vascularity is stable. There is mild prominence of the central pulmonary vascularity. No pneumothorax. No acute bony abnormality.  IMPRESSION: 1. Interval intubation with endotracheal tube in good anatomic position.  2. Diffuse pulmonary interstitial prominence remains and is unchanged. Findings are consistent with interstitial pulmonary edema or pneumonitis.   Electronically Signed   By: Maisie Fus  Register   On: 05/24/2013 15:58   Dg Chest Port 1 View  05/24/2013   CLINICAL DATA:  Altered mental status, hypoxia.  EXAM: PORTABLE CHEST - 1 VIEW  COMPARISON:  10/2011.  FINDINGS: Trachea is midline. Heart size normal. Mild diffuse interstitial prominence and indistinctness. Probable tiny bilateral effusions.  IMPRESSION: Question mild edema and tiny bilateral effusions.   Electronically Signed   By: Leanna Battles M.D.   On: 05/24/2013 14:00    Assessment/Plan  67 yo female with acute on chronic resp failure likely multifactorial with probable pna  Principal  Problem:   Respiratory failure, acute-on-chronic-  Pt with fever, copious production of sputum.  Pna.  Cover with vanc and zosyn.  Will hold off on steroids, no wheezing on exam with good air movement.  freq nebs.  Cont vent support for tonight, hopefully will wean in next couple of days.  ???may also have chf component.  Ck echo and serial enzymes, gentle ivf overnight. Ck flu.  Repeat cxr in am.  Active Problems:   Dyspnea   Hypertension   COPD (chronic obstructive pulmonary disease)   Chronic pain syndrome   Anxiety   Oxygen dependent   COPD exacerbation  Cc time 45 min  Brittney West 05/24/2013,  8:24 PM

## 2013-05-24 NOTE — ED Notes (Signed)
O2 changed to 10L

## 2013-05-24 NOTE — ED Notes (Signed)
Sats improving, mentation improving, pt states she is on chronic O2 unsure of L, states she has COPD, lung sounds very coarse.

## 2013-05-24 NOTE — ED Notes (Signed)
Pt's cousin Renelda Mom can be reached at [phone]

## 2013-05-24 NOTE — ED Notes (Signed)
Pt appeared cyanotic, checked vitals, BP 125/59, O2 sats 40, placed pt on partial non-re breather at 15L, sats responding, alerted EDP

## 2013-05-24 NOTE — ED Notes (Signed)
Preparing to intubate with dr Rubin Payor

## 2013-05-24 NOTE — ED Notes (Signed)
Holding sedation at this time, will monitor BP and sedation level.

## 2013-05-24 NOTE — ED Notes (Signed)
Phoned Dr. Pauletta Browns office, they are to fax pts hx and records

## 2013-05-24 NOTE — ED Notes (Addendum)
POCT CBG Result : 133

## 2013-05-24 NOTE — ED Notes (Signed)
Spoke with pt's family member, Renelda Mom pt's cousin. The pt has been living with the cousin since the pt's husband died a few weeks ago. Pt's family member did not know what the machine the pt uses to help her breathe was and states she didn't know the pt was supposed to wear the tubing. The pt has been without oxygen apparently for awhile. Pt's family says she can't take care of the pt anymore.

## 2013-05-24 NOTE — Progress Notes (Signed)
ANTIBIOTIC CONSULT NOTE - INITIAL  Pharmacy Consult for Vancomycin, Zosyn Indication: rule out pneumonia  Allergies  Allergen Reactions  . Advair Diskus [Fluticasone-Salmeterol]     Told by GI not to take.  [name] [Aspirin]     Due to GI problems  . Codeine     GI upset  . Nsaids     tigger migraine  . Other     PO/IV steroids--told by GI not to take.  . Stadol [Butorphanol]     Mental status changes  . Xanax [Alprazolam]     Told by GI not to take    Patient Measurements: Height: 5\' 4"  (162.6 cm) Weight: 110 lb 3.7 oz (50 kg) IBW/kg (Calculated) : 54.7 Adjusted Body Weight:   Vital Signs: Temp: 99 F (37.2 C) (12/15 2005) Temp src: Axillary (12/15 2005) BP: 114/58 mmHg (12/15 1853) Pulse Rate: 87 (12/15 1853) Intake/Output from previous day:   Intake/Output from this shift:    Labs:  Recent Labs  05/24/13 1245  WBC 14.0*  HGB 13.7  PLT 292  CREATININE 0.77   Estimated Creatinine Clearance: 55.3 ml/min (by C-G formula based on Cr of 0.77). No results found for this basename: VANCOTROUGH, VANCOPEAK, VANCORANDOM, GENTTROUGH, GENTPEAK, GENTRANDOM, TOBRATROUGH, TOBRAPEAK, TOBRARND, AMIKACINPEAK, AMIKACINTROU, AMIKACIN,  in the last 72 hours   Microbiology: No results found for this or any previous visit (from the past 720 hour(s)).  Medical History: Past Medical History  Diagnosis Date  . Hypertension   . COPD (chronic obstructive pulmonary disease)   . Oxygen dependent   . Hypoxia   . Peptic ulcer   . DDD (degenerative disc disease)   . Osteoporosis   . Osteoarthritis   . Chronic pain syndrome   . Back pain   . Hip pain, bilateral   . Wrist pain   . Anxiety     Medications:  Scheduled:  . albuterol  2.5 mg Nebulization Q6H   And  . ipratropium  0.5 mg Nebulization Q6H  . [START ON 05/25/2013] antiseptic oral rinse  15 mL Mouth Rinse QID  . [START ON 05/25/2013] antiseptic oral rinse  15 mL Mouth Rinse QID  . chlorhexidine  15 mL Mouth Rinse  BID  . enoxaparin (LOVENOX) injection  40 mg Subcutaneous Q24H  . fentaNYL  50 mcg Intravenous Once  . metoprolol succinate  50 mg Oral Daily  . pantoprazole (PROTONIX) IV  40 mg Intravenous QHS  . [START ON 05/25/2013] piperacillin-tazobactam (ZOSYN)  IV  3.375 g Intravenous Q8H  . [START ON 05/25/2013] vancomycin  1,000 mg Intravenous Q24H   Assessment: Pneumonia  Vancomycin 1 GM and Zosyn 3.375 GM IV given in ED earlier today  Goal of Therapy:  Vancomycin trough level 15-20 mcg/ml  Plan:  Zosyn 3.375 GM IV every 8 hours, infused over 4 hours Vancomycin 1 GM IV every 24 hours Vancomycin trough at steady state Monitor renal function Labs per protocol  Raquel James, Tobin Cadiente Bennett 05/24/2013,8:15 PM

## 2013-05-25 ENCOUNTER — Inpatient Hospital Stay (HOSPITAL_COMMUNITY): Payer: Medicare Other

## 2013-05-25 DIAGNOSIS — I517 Cardiomegaly: Secondary | ICD-10-CM

## 2013-05-25 LAB — TROPONIN I
Troponin I: <0.3 ng/mL (ref <0.30)
Troponin I: <0.3 ng/mL (ref <0.30)

## 2013-05-25 LAB — BASIC METABOLIC PANEL
BUN: 22 mg/dL (ref 6–23)
CO2: 27 mEq/L (ref 19–32)
Calcium: 8.1 mg/dL — ABNORMAL LOW (ref 8.4–10.5)
Chloride: 102 mEq/L (ref 96–112)
Creatinine, Ser: 0.9 mg/dL (ref 0.50–1.10)
Potassium: 3.6 mEq/L (ref 3.5–5.1)
Sodium: 139 mEq/L (ref 135–145)

## 2013-05-25 LAB — CBC WITH DIFFERENTIAL/PLATELET
Basophils Absolute: 0 10*3/uL (ref 0.0–0.1)
Basophils Relative: 0 % (ref 0–1)
Eosinophils Absolute: 0 10*3/uL (ref 0.0–0.7)
Eosinophils Relative: 0 % (ref 0–5)
HCT: 33.5 % — ABNORMAL LOW (ref 36.0–46.0)
Hemoglobin: 11.1 g/dL — ABNORMAL LOW (ref 12.0–15.0)
Lymphocytes Relative: 10 % — ABNORMAL LOW (ref 12–46)
MCHC: 33.1 g/dL (ref 30.0–36.0)
MCV: 95.2 fL (ref 78.0–100.0)
Monocytes Absolute: 1.1 10*3/uL — ABNORMAL HIGH (ref 0.1–1.0)
Neutrophils Relative %: 78 % — ABNORMAL HIGH (ref 43–77)
Platelets: 179 10*3/uL (ref 150–400)
RDW: 14.6 % (ref 11.5–15.5)
WBC: 8.6 10*3/uL (ref 4.0–10.5)

## 2013-05-25 LAB — STREP PNEUMONIAE URINARY ANTIGEN: Strep Pneumo Urinary Antigen: NEGATIVE

## 2013-05-25 LAB — LACTIC ACID, PLASMA: Lactic Acid, Venous: 1.3 mmol/L (ref 0.5–2.2)

## 2013-05-25 LAB — GLUCOSE, CAPILLARY: Glucose-Capillary: 94 mg/dL (ref 70–99)

## 2013-05-25 MED ORDER — FUROSEMIDE 10 MG/ML IJ SOLN
20.0000 mg | Freq: Once | INTRAMUSCULAR | Status: AC
  Administered 2013-05-25: 20 mg via INTRAVENOUS
  Filled 2013-05-25: qty 2

## 2013-05-25 MED ORDER — ACETAMINOPHEN 650 MG RE SUPP
650.0000 mg | RECTAL | Status: DC | PRN
  Administered 2013-05-25: 650 mg via RECTAL
  Filled 2013-05-25: qty 1

## 2013-05-25 MED ORDER — SODIUM CHLORIDE 0.9 % IV SOLN
INTRAVENOUS | Status: AC
  Administered 2013-05-25: 10:00:00 via INTRAVENOUS

## 2013-05-25 MED ORDER — SODIUM CHLORIDE 0.9 % IV BOLUS (SEPSIS)
500.0000 mL | Freq: Once | INTRAVENOUS | Status: AC
  Administered 2013-05-25: 500 mL via INTRAVENOUS

## 2013-05-25 MED ORDER — METOPROLOL TARTRATE 25 MG/10 ML ORAL SUSPENSION
50.0000 mg | Freq: Two times a day (BID) | ORAL | Status: DC
  Administered 2013-05-25: 50 mg
  Filled 2013-05-25 (×3): qty 20

## 2013-05-25 MED ORDER — ACETAMINOPHEN 325 MG PO TABS: 650.0000 mg | ORAL_TABLET | Freq: Four times a day (QID) | ORAL | Status: DC | PRN

## 2013-05-25 NOTE — Clinical Social Work Note (Signed)
CSW received consult for issues with oxygen but also may not have housing at d/c. CM also aware. Pt currently on vent. Will follow up when pt is appropriate for assessment.   Derenda Fennel, [place] [phone]

## 2013-05-25 NOTE — Progress Notes (Signed)
UR chart review completed.  

## 2013-05-25 NOTE — Progress Notes (Signed)
*  PRELIMINARY RESULTS* Echocardiogram 2D Echocardiogram has been performed.  Brittney West 05/25/2013, 4:32 PM

## 2013-05-25 NOTE — Consult Note (Signed)
Consult requested by: Dr. Irene Limbo Consult requested for respiratory failure:  HPI: This is a 67 year old Caucasian female with a known history of COPD who has been on home oxygen. She apparently had significant changes in that her husband died then she was evicted from her apartment and went to live with family but did not have her oxygen available. She became more short of breath and eventually was brought to the emergency department. When she was seen in the emergency department she was hypoxic and having respiratory distress. She was intubated and was found to have copious secretions which were yellow. She was also febrile. She can't give any other history at this point because she is intubated and on mechanical ventilation. She can nod her head. She has a smoking history but it's not clear to me how much.  Past Medical History  Diagnosis Date  . Hypertension   . COPD (chronic obstructive pulmonary disease)   . Oxygen dependent   . Hypoxia   . Peptic ulcer   . DDD (degenerative disc disease)   . Osteoporosis   . Osteoarthritis   . Chronic pain syndrome   . Back pain   . Hip pain, bilateral   . Wrist pain   . Anxiety      History reviewed. No pertinent family history.   History   Social History  . Marital Status: Widowed    Spouse Name: N/A    Number of Children: N/A  . Years of Education: N/A   Social History Main Topics  . Smoking status: Current Every Day Smoker    Types: Cigarettes  . Smokeless tobacco: None  . Alcohol Use: No  . Drug Use: No  . Sexual Activity: None   Other Topics Concern  . None   Social History Narrative  . None     ROS: Not obtainable    Objective: Vital signs in last 24 hours: Temp:  [98.7 F (37.1 C)-100.8 F (38.2 C)] 98.9 F (37.2 C) (12/16 0400) Pulse Rate:  [45-121] 93 (12/16 0815) Resp:  [12-32] 15 (12/16 0815) BP: (77-150)/(41-100) 104/63 mmHg (12/16 0815) SpO2:  [49 %-100 %] 94 % (12/16 0815) FiO2 (%):  [40 %-60 %] 40  % (12/16 0818) Weight:  [50 kg (110 lb 3.7 oz)-57.607 kg (127 lb)] 54.4 kg (119 lb 14.9 oz) (12/16 0500) Weight change:  Last BM Date:  (unsure)  Intake/Output from previous day: 12/15 0701 - 12/16 0700 In: 852.5 [I.V.:802.5; IV Piggyback:50] Out: 205 [Urine:205]  PHYSICAL EXAM She is awake and alert intubated and on the ventilator. Her mucous membranes are moist. Her pupils are reactive. Nose and throat are clear. Her neck does not show significant JVD even with her lying flat. Her chest shows rales bilaterally and bilateral rhonchi. Her heart is regular I do not hear a gallop. Her abdomen is soft without masses. His central nervous system exam is grossly intact  Lab Results: Basic Metabolic Panel:  Recent Labs  16/10/96 1245 05/25/13 0419  NA 137 139  K 4.2 3.6  CL 93* 102  CO2 29 27  GLUCOSE 140* 89  BUN 21 22  CREATININE 0.77 0.90  CALCIUM 9.6 8.1*   Liver Function Tests:  Recent Labs  05/24/13 1247  AST 23  ALT 15  ALKPHOS 166*  BILITOT 0.3  PROT 8.0  ALBUMIN 3.4*    Recent Labs  05/24/13 1247  LIPASE 16   No results found for this basename: AMMONIA,  in the last 72 hours CBC:  Recent Labs  05/24/13 1245 05/25/13 0419  WBC 14.0* 8.6  NEUTROABS  --  6.7  HGB 13.7 11.1*  HCT 41.5 33.5*  MCV 94.7 95.2  PLT 292 179   Cardiac Enzymes:  Recent Labs  05/24/13 1616 05/24/13 2009 05/25/13 0142  TROPONINI <0.30 <0.30 <0.30   BNP:  Recent Labs  05/24/13 1245  PROBNP 5934.0*   D-Dimer: No results found for this basename: DDIMER,  in the last 72 hours CBG:  Recent Labs  05/24/13 1239 05/24/13 2101 05/25/13 0011  GLUCAP 133* 100* 94   Hemoglobin A1C: No results found for this basename: HGBA1C,  in the last 72 hours Fasting Lipid Panel: No results found for this basename: CHOL, HDL, LDLCALC, TRIG, CHOLHDL, LDLDIRECT,  in the last 72 hours Thyroid Function Tests: No results found for this basename: TSH, T4TOTAL, FREET4, T3FREE,  THYROIDAB,  in the last 72 hours Anemia Panel: No results found for this basename: VITAMINB12, FOLATE, FERRITIN, TIBC, IRON, RETICCTPCT,  in the last 72 hours Coagulation: No results found for this basename: LABPROT, INR,  in the last 72 hours Urine Drug Screen: Drugs of Abuse     Component Value Date/Time   LABOPIA NONE DETECTED 05/24/2013 1428   COCAINSCRNUR NONE DETECTED 05/24/2013 1428   LABBENZ POSITIVE* 05/24/2013 1428   AMPHETMU NONE DETECTED 05/24/2013 1428   THCU NONE DETECTED 05/24/2013 1428   LABBARB NONE DETECTED 05/24/2013 1428    Alcohol Level:  Recent Labs  05/24/13 1247  ETH <11   Urinalysis:  Recent Labs  05/24/13 1656  COLORURINE YELLOW  LABSPEC >1.030*  PHURINE 6.0  GLUCOSEU NEGATIVE  HGBUR LARGE*  BILIRUBINUR NEGATIVE  KETONESUR 15*  PROTEINUR 100*  UROBILINOGEN 0.2  NITRITE POSITIVE*  LEUKOCYTESUR NEGATIVE   Misc. Labs:   ABGS:  Recent Labs  05/24/13 1500  PHART 7.371  PO2ART 167.0*  TCO2 23.9  HCO3 26.5*     MICROBIOLOGY: Recent Results (from the past 240 hour(s))  MRSA PCR SCREENING     Status: None   Collection Time    05/24/13  6:41 PM      Result Value Range Status   MRSA by PCR NEGATIVE  NEGATIVE Final   Comment:            The GeneXpert MRSA Assay (FDA     approved for NASAL specimens     only), is one component of a     comprehensive MRSA colonization     surveillance program. It is not     intended to diagnose MRSA     infection nor to guide or     monitor treatment for     MRSA infections.  CULTURE, BLOOD (ROUTINE X 2)     Status: None   Collection Time    05/24/13  8:09 PM      Result Value Range Status   Specimen Description BLOOD RIGHT ARM   Final   Special Requests BOTTLES DRAWN AEROBIC AND ANAEROBIC 6CC BOTTLES   Final   Culture PENDING   Incomplete   Report Status PENDING   Incomplete  CULTURE, BLOOD (ROUTINE X 2)     Status: None   Collection Time    05/24/13  8:09 PM      Result Value Range  Status   Specimen Description BLOOD RIGHT HAND   Final   Special Requests BOTTLES DRAWN AEROBIC AND ANAEROBIC 6CC BOTTLES   Final   Culture PENDING   Incomplete   Report Status PENDING  Incomplete    Studies/Results: Dg Chest 1v Repeat Same Day  05/24/2013   CLINICAL DATA:  Intubation.  EXAM: CHEST - 1 VIEW SAME DAY  COMPARISON:  05/24/2013.  FINDINGS: Endotracheal tube noted with its tip approximately 3.9 cm above the carina. Previously identified mild pulmonary interstitial prominence is again noted. Small pleural effusions cannot be entirely excluded. These findings suggest interstitial edema. Pneumonitis cannot be excluded. Heart size and pulmonary vascularity is stable. There is mild prominence of the central pulmonary vascularity. No pneumothorax. No acute bony abnormality.  IMPRESSION: 1. Interval intubation with endotracheal tube in good anatomic position.  2. Diffuse pulmonary interstitial prominence remains and is unchanged. Findings are consistent with interstitial pulmonary edema or pneumonitis.   Electronically Signed   By: Maisie Fus  Register   On: 05/24/2013 15:58   Portable Chest Xray In Am  05/25/2013   CLINICAL DATA:  Respiratory failure  EXAM: PORTABLE CHEST - 1 VIEW  COMPARISON:  05/24/2013  FINDINGS: Cardiomediastinal silhouette is stable. Endotracheal tube in place with tip 3.7 cm above the carina. Worsening mild interstitial prominence bilaterally suspicious for pneumonitis or edema. No segmental infiltrate.  IMPRESSION: Endotracheal tube in place. Worsening interstitial prominence bilaterally suspicious for edema or pneumonitis. No segmental infiltrate.   Electronically Signed   By: Natasha Mead M.D.   On: 05/25/2013 07:35   Dg Chest Port 1 View  05/24/2013   CLINICAL DATA:  Altered mental status, hypoxia.  EXAM: PORTABLE CHEST - 1 VIEW  COMPARISON:  10/2011.  FINDINGS: Trachea is midline. Heart size normal. Mild diffuse interstitial prominence and indistinctness. Probable tiny  bilateral effusions.  IMPRESSION: Question mild edema and tiny bilateral effusions.   Electronically Signed   By: Leanna Battles M.D.   On: 05/24/2013 14:00    Medications:  Prior to Admission:  Prescriptions prior to admission  Medication Sig Dispense Refill  . amoxicillin (AMOXIL) 875 MG tablet Take 875 mg by mouth 3 (three) times daily. 10 day course stating on 05/10/2013      . COMBIVENT RESPIMAT 20-100 MCG/ACT AERS respimat Inhale 1 puff into the lungs every 4 (four) hours as needed. For shortness of breath/wheezing      . diazepam (VALIUM) 10 MG tablet Take 10 mg by mouth 3 (three) times daily as needed. For anxiety      . ENDOCET 10-325 MG per tablet Take 1 tablet by mouth 4 (four) times daily as needed. For pain      . gabapentin (NEURONTIN) 300 MG capsule Take 300 mg by mouth 3 (three) times daily.      . metoprolol succinate (TOPROL-XL) 50 MG 24 hr tablet Take 50 mg by mouth daily.      Marland Kitchen omeprazole (PRILOSEC) 40 MG capsule Take 40 mg by mouth daily.      . ondansetron (ZOFRAN) 4 MG tablet Take 4 mg by mouth 3 (three) times daily as needed. For nausea and/or vomiting       Scheduled: . albuterol  2.5 mg Nebulization Q6H   And  . ipratropium  0.5 mg Nebulization Q6H  . antiseptic oral rinse  15 mL Mouth Rinse QID  . antiseptic oral rinse  15 mL Mouth Rinse QID  . chlorhexidine  15 mL Mouth Rinse BID  . enoxaparin (LOVENOX) injection  40 mg Subcutaneous Q24H  . fentaNYL  50 mcg Intravenous Once  . metoprolol succinate  50 mg Oral Daily  . pantoprazole (PROTONIX) IV  40 mg Intravenous QHS  . piperacillin-tazobactam (ZOSYN)  IV  3.375 g Intravenous Q8H  . vancomycin  1,000 mg Intravenous Q24H   Continuous: . fentaNYL infusion INTRAVENOUS 100 mcg/hr (05/25/13 0800)   ZOX:[id number]  Assesment: She has what appears to be acute on chronic respiratory failure. It is felt that she may have a multifocal pneumonia but chest x-ray is also suggestive of pulmonary edema. Based on the  fact that she had fever and copious secretions she's been treated for pneumonia which I think is appropriate but she also needs to be evaluated for pulmonary edema. Principal Problem:   Respiratory failure, acute-on-chronic Active Problems:   Dyspnea   Hypertension   COPD (chronic obstructive pulmonary disease)   Chronic pain syndrome   Anxiety   Oxygen dependent   COPD exacerbation   PNA (pneumonia)   UTI (urinary tract infection)    Plan: Continue treatments. I think she'll be able to be weaned fairly quickly.    LOS: 1 day   Rhemi Balbach L 05/25/2013, 8:33 AM

## 2013-05-25 NOTE — Progress Notes (Signed)
[facility] PROGRESS NOTE  Brittney West UEA:[id number] DOB: 1946/01/22 DOA: 05/24/2013 PCP: Ernestine Conrad, MD  Assessment/Plan: 1. Acute on chronic hypoxic respiratory failure, ventilator dependent, appears stable 2. Possible community-acquired pneumonia, no infiltrate on chest x-ray, remains afebrile, leukocytosis has resolved. 3. COPD without evidence of exacerbation, appears stable 4. Acute encephalopathy, appears resolved 5. Suspected pulmonary edema 6. Vomiting, diarrhea; significance unclear. Monitor clinically. Abdomen soft. 7. Positive troponin, repeat troponins negative. Secondary to strain. No further evaluation suggested. 8. Chronic pain syndrome, stable 9. Anxiety 10. Social: Patient's husband died a few weeks ago and she has subsequently been living with a cousin without her usual oxygen.   Continue oxygen, nebulizers, antibiotics. Pulmonology consult appreciated. Given clinical improvement will not adjust antibiotics at this point.  Hopefully can exubate within the next 24 hours  Echocardiogram, serial troponin, check BNP, strict I/O., daily weights. Trial Lasix.  Social work consult  Pending studies:   Blood cultures  Legionella and strep pneumonia antigens  Respiratory culture  Code Status: full code DVT prophylaxis: Lovenox Family Communication:  Disposition Plan: pending further evaluation  Brendia Sacks, MD  [facility]  Pager [phone] If 7PM-7AM, please contact night-coverage at [email or web address], password [facility] 05/25/2013, 7:52 AM  LOS: 1 day   Summary: 67 year old woman with chronic respiratory failure oxygen dependent secondary to COPD presents the emergency department with shortness of breath. Found to be in respiratory distress, ABG revealed respiratory acidosis and patient was intubated in the emergency department. Her husband died a few weeks ago she was evicted from her apartment and has not been able to take her oxygen with her.  Off oxygen for approximately one week.  Consultants:  Pulmonology  Procedures:  ETT 12/15 >>   Antibiotics:  Zosyn 12/15 >>   Vancomycin 12/15 >>   HPI/Subjective: Did well overnight. Awake and calm on fentanyl infusion. No new issues per nursing.  Objective: Filed Vitals:   05/25/13 0615 05/25/13 0630 05/25/13 0645 05/25/13 0747  BP: 111/53 125/64 109/55   Pulse: 87 90 87   Temp:      TempSrc:      Resp: 15 15 15    Height:      Weight:      SpO2: 94% 94% 94% 95%    Intake/Output Summary (Last 24 hours) at 05/25/13 0752 Last data filed at 05/25/13 0500  Gross per 24 hour  Intake  761.5 ml  Output    205 ml  Net  556.5 ml     Filed Weights   05/24/13 1520 05/24/13 1853 05/25/13 0500  Weight: 57.607 kg (127 lb) 50 kg (110 lb 3.7 oz) 54.4 kg (119 lb 14.9 oz)    Exam:   Afebrile, vital signs stable. Oxygen saturation 95% on 40% FiO2.  General: Appears calm, comfortable, alert. Intubated.  Eyes: Pupils equal, round, reactive to light. Lids and irises appear unremarkable.  Neck: Appears unremarkable  Cardiovascular: Regular rate and rhythm, no murmur, rub or gallop. No lower extremity edema.  Respiratory: Clear to auscultation bilaterally, fair air movement, no frank wheezes, rales or rhonchi. Appears comfortable  Abdomen: Soft, nontender, nondistended. No masses appreciated. Skin appears unremarkable.   musculoskeletal: Movesall extremities to command. Feet warm and dry.  Data Reviewed:  +556  Basic metabolic panel unremarkable  Troponins negative thus far  Lactic acid normal  Influenza PCR negative  Repeat chest x-ray: Independently reviewed, appears worse compared to yesterday, suspicious for pulmonary edema. I concur with radiologist interpretation worsening interstitial prominence suspicious for edema  or pneumonitis  Scheduled Meds: . albuterol  2.5 mg Nebulization Q6H   And  . ipratropium  0.5 mg Nebulization Q6H  . antiseptic oral  rinse  15 mL Mouth Rinse QID  . antiseptic oral rinse  15 mL Mouth Rinse QID  . chlorhexidine  15 mL Mouth Rinse BID  . enoxaparin (LOVENOX) injection  40 mg Subcutaneous Q24H  . fentaNYL  50 mcg Intravenous Once  . metoprolol succinate  50 mg Oral Daily  . pantoprazole (PROTONIX) IV  40 mg Intravenous QHS  . piperacillin-tazobactam (ZOSYN)  IV  3.375 g Intravenous Q8H  . vancomycin  1,000 mg Intravenous Q24H   Continuous Infusions: . fentaNYL infusion INTRAVENOUS 100 mcg/hr (05/25/13 0500)    Principal Problem:   Respiratory failure, acute-on-chronic Active Problems:   Dyspnea   Hypertension   COPD (chronic obstructive pulmonary disease)   Chronic pain syndrome   Anxiety   Oxygen dependent   COPD exacerbation   PNA (pneumonia)   UTI (urinary tract infection)   Time spent 25 minutes

## 2013-05-25 NOTE — Care Management Note (Signed)
    Page 1 of 1   05/25/2013     4:12:03 PM   CARE MANAGEMENT NOTE 05/25/2013  Patient:  Brittney West, Brittney West   Account Number:  [id number]  Date Initiated:  05/25/2013  Documentation initiated by:  Sharrie Rothman  Subjective/Objective Assessment:   Pt admitted from home with a family member with pneumonia and respiratory failure. Pt currently on vent. According to family pt is unable to care for herself and needs placement. Pt just lost her husband and was evicted from her home and     Action/Plan:   did not take her O2 with her. PT will need to evaluate pt once off ventilator. Will continue to follow for discharge planning needs. CSW aware of pt.   Anticipated DC Date:  06/01/2013   Anticipated DC Plan:  SKILLED NURSING FACILITY  In-house referral  Clinical Social Worker      DC Planning Services  CM consult      Choice offered to / List presented to:             Status of service:  Completed, signed off Medicare Important Message given?   (If response is "NO", the following Medicare IM given date fields will be blank) Date Medicare IM given:   Date Additional Medicare IM given:    Discharge Disposition:  SKILLED NURSING FACILITY  Per UR Regulation:    If discussed at Long Length of Stay Meetings, dates discussed:    Comments:  05/25/13 1605 Arlyss Queen, RN BSN CM

## 2013-05-25 NOTE — Progress Notes (Signed)
INITIAL NUTRITION ASSESSMENT  DOCUMENTATION CODES Per approved criteria  -Not Applicable   INTERVENTION: If pt unable to wean as anticipated: Initiate Vital AF1.2 @ 40 ml/hr via OGT. Add 30 ml Prostat daily.  At goal rate, tube feeding regimen will provide 1252 kcal, 86 grams of protein, and 778 ml of H2O.   NUTRITION DIAGNOSIS: Inadequate oral intake related to inability to eat as evidenced by NPO status.  Goal: Pt to meet >/= 90% of their estimated nutrition needs   Monitor:  Respiratory/vent status, nutrition support   Reason for Assessment: Mechanical Ventilation  67 y.o. female  Admitting Dx: Respiratory failure, acute-on-chronic  ASSESSMENT: Patient is currently intubated on ventilator support.  MV: 8.3  L/min Temp (24hrs), Avg:99.9 F (37.7 C), Min:98.7 F (37.1 C), Max:100.9 F (38.3 C)  Propofol: 3.5 ml/hr provides 92 kcal of lipids q 24 hr  Height: Ht Readings from Last 1 Encounters:  05/24/13 5\' 4"  (1.626 m)    Weight: Wt Readings from Last 1 Encounters:  05/25/13 119 lb 14.9 oz (54.4 kg)    Ideal Body Weight: 120# (54.5 kg)  % Ideal Body Weight: 100%  Wt Readings from Last 10 Encounters:  05/25/13 119 lb 14.9 oz (54.4 kg)    Usual Body Weight: unknown  % Usual Body Weight: ----  BMI:  Body mass index is 20.58 kg/(m^2).normal range  Estimated Nutritional Needs: Kcal: 1379 Protein: 80-92 gr Fluid: 1600 normal needs  Skin: intact  Diet Order: NPO  EDUCATION NEEDS: -No education needs identified at this time   Intake/Output Summary (Last 24 hours) at 05/25/13 1211 Last data filed at 05/25/13 0800  Gross per 24 hour  Intake  862.5 ml  Output    205 ml  Net  657.5 ml    Last BM: PTA  Labs:   Recent Labs Lab 05/24/13 1245 05/25/13 0419  NA 137 139  K 4.2 3.6  CL 93* 102  CO2 29 27  BUN 21 22  CREATININE 0.77 0.90  CALCIUM 9.6 8.1*  GLUCOSE 140* 89    CBG (last 3)   Recent Labs  05/24/13 1239 05/24/13 2101  05/25/13 0011  GLUCAP 133* 100* 94    Scheduled Meds: . albuterol  2.5 mg Nebulization Q6H   And  . ipratropium  0.5 mg Nebulization Q6H  . antiseptic oral rinse  15 mL Mouth Rinse QID  . antiseptic oral rinse  15 mL Mouth Rinse QID  . chlorhexidine  15 mL Mouth Rinse BID  . enoxaparin (LOVENOX) injection  40 mg Subcutaneous Q24H  . fentaNYL  50 mcg Intravenous Once  . metoprolol tartrate  50 mg Per Tube BID  . pantoprazole (PROTONIX) IV  40 mg Intravenous QHS  . piperacillin-tazobactam (ZOSYN)  IV  3.375 g Intravenous Q8H  . vancomycin  1,000 mg Intravenous Q24H    Continuous Infusions: . sodium chloride    . fentaNYL infusion INTRAVENOUS 100 mcg/hr (05/25/13 0800)    Past Medical History  Diagnosis Date  . Hypertension   . COPD (chronic obstructive pulmonary disease)   . Oxygen dependent   . Hypoxia   . Peptic ulcer   . DDD (degenerative disc disease)   . Osteoporosis   . Osteoarthritis   . Chronic pain syndrome   . Back pain   . Hip pain, bilateral   . Wrist pain   . Anxiety     History reviewed. No pertinent past surgical history.  Royann Shivers MS,RD,CSG,LDN Office: [phone] Pager: [phone]

## 2013-05-26 LAB — BASIC METABOLIC PANEL
BUN: 22 mg/dL (ref 6–23)
CO2: 30 mEq/L (ref 19–32)
Calcium: 8.1 mg/dL — ABNORMAL LOW (ref 8.4–10.5)
Chloride: 101 mEq/L (ref 96–112)
Creatinine, Ser: 1.43 mg/dL — ABNORMAL HIGH (ref 0.50–1.10)
GFR calc non Af Amer: 38 mL/min — ABNORMAL LOW (ref >90)
Glucose, Bld: 114 mg/dL — ABNORMAL HIGH (ref 70–99)

## 2013-05-26 LAB — BLOOD GAS, ARTERIAL
Bicarbonate: 31.9 mEq/L — ABNORMAL HIGH (ref 20.0–24.0)
Drawn by: 234301
Mode: POSITIVE
Patient temperature: 37
Pressure support: 5 cmH2O
TCO2: 29.7 mmol/L (ref 0–100)
pCO2 arterial: 63.8 mmHg (ref 35.0–45.0)
pH, Arterial: 7.319 — ABNORMAL LOW (ref 7.350–7.450)
pO2, Arterial: 83.5 mmHg (ref 80.0–100.0)

## 2013-05-26 LAB — RESPIRATORY VIRUS PANEL
Adenovirus: NOT DETECTED
Influenza A H1: NOT DETECTED
Influenza A H3: NOT DETECTED
Influenza A: NOT DETECTED
Influenza B: NOT DETECTED
Parainfluenza 2: NOT DETECTED
Respiratory Syncytial Virus A: NOT DETECTED
Rhinovirus: NOT DETECTED

## 2013-05-26 LAB — GLUCOSE, CAPILLARY: Glucose-Capillary: 106 mg/dL — ABNORMAL HIGH (ref 70–99)

## 2013-05-26 MED ORDER — ACETYLCYSTEINE 20 % IN SOLN
RESPIRATORY_TRACT | Status: AC
  Administered 2013-05-26: 3 mL via RESPIRATORY_TRACT
  Filled 2013-05-26: qty 4

## 2013-05-26 MED ORDER — ACETYLCYSTEINE 20 % IN SOLN
3.0000 mL | RESPIRATORY_TRACT | Status: DC
  Administered 2013-05-26 (×3): 3 mL via RESPIRATORY_TRACT
  Administered 2013-05-27 (×3): 4 mL via RESPIRATORY_TRACT
  Administered 2013-05-28: 02:00:00 via RESPIRATORY_TRACT
  Administered 2013-05-28 – 2013-05-30 (×6): 3 mL via RESPIRATORY_TRACT
  Filled 2013-05-26 (×14): qty 4

## 2013-05-26 MED ORDER — ALBUTEROL SULFATE (5 MG/ML) 0.5% IN NEBU
2.5000 mg | INHALATION_SOLUTION | RESPIRATORY_TRACT | Status: DC
  Administered 2013-05-26 – 2013-05-31 (×25): 2.5 mg via RESPIRATORY_TRACT
  Filled 2013-05-26 (×26): qty 0.5

## 2013-05-26 MED ORDER — DEXTROSE 5 % IV SOLN
500.0000 mg | INTRAVENOUS | Status: DC
  Administered 2013-05-26 – 2013-05-31 (×6): 500 mg via INTRAVENOUS
  Filled 2013-05-26 (×8): qty 500

## 2013-05-26 MED ORDER — IPRATROPIUM BROMIDE 0.02 % IN SOLN
0.5000 mg | RESPIRATORY_TRACT | Status: DC
  Administered 2013-05-26 – 2013-05-31 (×25): 0.5 mg via RESPIRATORY_TRACT
  Filled 2013-05-26 (×26): qty 2.5

## 2013-05-26 MED ORDER — ALBUTEROL SULFATE (5 MG/ML) 0.5% IN NEBU
2.5000 mg | INHALATION_SOLUTION | RESPIRATORY_TRACT | Status: DC | PRN
  Administered 2013-05-29: 2.5 mg via RESPIRATORY_TRACT
  Filled 2013-05-26: qty 0.5

## 2013-05-26 MED ORDER — ONDANSETRON HCL 4 MG/2ML IJ SOLN
4.0000 mg | Freq: Four times a day (QID) | INTRAMUSCULAR | Status: DC | PRN
  Administered 2013-05-26 – 2013-05-30 (×2): 4 mg via INTRAVENOUS
  Filled 2013-05-26: qty 2

## 2013-05-26 MED ORDER — PNEUMOCOCCAL VAC POLYVALENT 25 MCG/0.5ML IJ INJ
0.5000 mL | INJECTION | INTRAMUSCULAR | Status: AC
  Administered 2013-05-28: 0.5 mL via INTRAMUSCULAR
  Filled 2013-05-26: qty 0.5

## 2013-05-26 MED ORDER — ONDANSETRON HCL 4 MG/2ML IJ SOLN
INTRAMUSCULAR | Status: AC
  Filled 2013-05-26: qty 2

## 2013-05-26 MED ORDER — METOPROLOL TARTRATE 50 MG PO TABS
50.0000 mg | ORAL_TABLET | Freq: Two times a day (BID) | ORAL | Status: DC
  Administered 2013-05-26 – 2013-05-31 (×11): 50 mg via ORAL
  Filled 2013-05-26: qty 1
  Filled 2013-05-26: qty 2
  Filled 2013-05-26: qty 1
  Filled 2013-05-26: qty 2
  Filled 2013-05-26: qty 1
  Filled 2013-05-26 (×4): qty 2
  Filled 2013-05-26 (×2): qty 1

## 2013-05-26 MED ORDER — INFLUENZA VAC SPLIT QUAD 0.5 ML IM SUSP
0.5000 mL | INTRAMUSCULAR | Status: AC
  Administered 2013-05-28: 0.5 mL via INTRAMUSCULAR
  Filled 2013-05-26: qty 0.5

## 2013-05-26 MED ORDER — GUAIFENESIN ER 600 MG PO TB12
1200.0000 mg | ORAL_TABLET | Freq: Two times a day (BID) | ORAL | Status: DC
  Administered 2013-05-26 – 2013-05-31 (×10): 1200 mg via ORAL
  Filled 2013-05-26 (×10): qty 2

## 2013-05-26 NOTE — Procedures (Signed)
Extubation Procedure Note  Patient Details:   Name: Brittney West DOB: [birth year] MRN: [id number]   Airway Documentation:  Airway 7.5 mm (Active)  Secured at (cm) 22 cm 05/26/2013  8:09 AM  Measured From Lips 05/26/2013  8:09 AM  Secured Location Center 05/26/2013  8:09 AM  Secured By [employer] 05/26/2013  3:24 AM  Tube Holder Repositioned Yes 05/26/2013  8:09 AM  Cuff Pressure (cm H2O) 22 cm H2O 05/25/2013  7:23 PM  Site Condition Dry 05/26/2013  8:09 AM    Evaluation  O2 sats: stable throughout Complications: No apparent complications Patient did tolerate procedure well. Bilateral Breath Sounds: Diminished;Rhonchi Suctioning: Oral;Airway Yes  Katheren Shams 05/26/2013, 9:16 AM

## 2013-05-26 NOTE — Progress Notes (Signed)
Subjective: She looks much better this morning. Her echocardiogram showed probable diastolic dysfunction. She has responded well to Lasix.  Objective: Vital signs in last 24 hours: Temp:  [98 F (36.7 C)-101.4 F (38.6 C)] 98.6 F (37 C) (12/17 0800) Pulse Rate:  [65-109] 83 (12/17 0800) Resp:  [14-31] 20 (12/17 0800) BP: (78-160)/(43-95) 96/82 mmHg (12/17 0800) SpO2:  [89 %-100 %] 95 % (12/17 0800) FiO2 (%):  [40 %] 40 % (12/17 0706) Weight change:  Last BM Date:  (unsure)  Intake/Output from previous day: 12/16 0701 - 12/17 0700 In: 1455.3 [I.V.:1105.3; IV Piggyback:350] Out: 1700 [Urine:1700]  PHYSICAL EXAM General appearance: alert, cooperative and mild distress Resp: rhonchi bilaterally Cardio: regular rate and rhythm, S1, S2 normal, no murmur, click, rub or gallop GI: soft, non-tender; bowel sounds normal; no masses,  no organomegaly Extremities: extremities normal, atraumatic, no cyanosis or edema  Lab Results:    Basic Metabolic Panel:  Recent Labs  84/13/24 0419 05/26/13 0437  NA 139 142  K 3.6 3.3*  CL 102 101  CO2 27 30  GLUCOSE 89 114*  BUN 22 22  CREATININE 0.90 1.43*  CALCIUM 8.1* 8.1*   Liver Function Tests:  Recent Labs  05/24/13 1247  AST 23  ALT 15  ALKPHOS 166*  BILITOT 0.3  PROT 8.0  ALBUMIN 3.4*    Recent Labs  05/24/13 1247  LIPASE 16   No results found for this basename: AMMONIA,  in the last 72 hours CBC:  Recent Labs  05/24/13 1245 05/25/13 0419  WBC 14.0* 8.6  NEUTROABS  --  6.7  HGB 13.7 11.1*  HCT 41.5 33.5*  MCV 94.7 95.2  PLT 292 179   Cardiac Enzymes:  Recent Labs  05/24/13 2009 05/25/13 0142 05/25/13 0802  TROPONINI <0.30 <0.30 <0.30   BNP:  Recent Labs  05/24/13 1245 05/25/13 0802  PROBNP 5934.0* 3740.0*   D-Dimer: No results found for this basename: DDIMER,  in the last 72 hours CBG:  Recent Labs  05/24/13 1239 05/24/13 2101 05/25/13 0011  GLUCAP 133* 100* 94   Hemoglobin  A1C: No results found for this basename: HGBA1C,  in the last 72 hours Fasting Lipid Panel: No results found for this basename: CHOL, HDL, LDLCALC, TRIG, CHOLHDL, LDLDIRECT,  in the last 72 hours Thyroid Function Tests: No results found for this basename: TSH, T4TOTAL, FREET4, T3FREE, THYROIDAB,  in the last 72 hours Anemia Panel: No results found for this basename: VITAMINB12, FOLATE, FERRITIN, TIBC, IRON, RETICCTPCT,  in the last 72 hours Coagulation: No results found for this basename: LABPROT, INR,  in the last 72 hours Urine Drug Screen: Drugs of Abuse     Component Value Date/Time   LABOPIA NONE DETECTED 05/24/2013 1428   COCAINSCRNUR NONE DETECTED 05/24/2013 1428   LABBENZ POSITIVE* 05/24/2013 1428   AMPHETMU NONE DETECTED 05/24/2013 1428   THCU NONE DETECTED 05/24/2013 1428   LABBARB NONE DETECTED 05/24/2013 1428    Alcohol Level:  Recent Labs  05/24/13 1247  ETH <11   Urinalysis:  Recent Labs  05/24/13 1656  COLORURINE YELLOW  LABSPEC >1.030*  PHURINE 6.0  GLUCOSEU NEGATIVE  HGBUR LARGE*  BILIRUBINUR NEGATIVE  KETONESUR 15*  PROTEINUR 100*  UROBILINOGEN 0.2  NITRITE POSITIVE*  LEUKOCYTESUR NEGATIVE   Misc. Labs:  ABGS  Recent Labs  05/24/13 1500  PHART 7.371  PO2ART 167.0*  TCO2 23.9  HCO3 26.5*   CULTURES Recent Results (from the past 240 hour(s))  MRSA PCR SCREENING  Status: None   Collection Time    05/24/13  6:41 PM      Result Value Range Status   MRSA by PCR NEGATIVE  NEGATIVE Final   Comment:            The GeneXpert MRSA Assay (FDA     approved for NASAL specimens     only), is one component of a     comprehensive MRSA colonization     surveillance program. It is not     intended to diagnose MRSA     infection nor to guide or     monitor treatment for     MRSA infections.  CULTURE, BLOOD (ROUTINE X 2)     Status: None   Collection Time    05/24/13  8:09 PM      Result Value Range Status   Specimen Description BLOOD  RIGHT ARM   Final   Special Requests BOTTLES DRAWN AEROBIC AND ANAEROBIC 6CC BOTTLES   Final   Culture NO GROWTH 1 DAY   Final   Report Status PENDING   Incomplete  CULTURE, BLOOD (ROUTINE X 2)     Status: None   Collection Time    05/24/13  8:09 PM      Result Value Range Status   Specimen Description BLOOD RIGHT HAND   Final   Special Requests BOTTLES DRAWN AEROBIC AND ANAEROBIC 6CC BOTTLES   Final   Culture NO GROWTH 1 DAY   Final   Report Status PENDING   Incomplete   Studies/Results: Dg Chest 1v Repeat Same Day  05/24/2013   CLINICAL DATA:  Intubation.  EXAM: CHEST - 1 VIEW SAME DAY  COMPARISON:  05/24/2013.  FINDINGS: Endotracheal tube noted with its tip approximately 3.9 cm above the carina. Previously identified mild pulmonary interstitial prominence is again noted. Small pleural effusions cannot be entirely excluded. These findings suggest interstitial edema. Pneumonitis cannot be excluded. Heart size and pulmonary vascularity is stable. There is mild prominence of the central pulmonary vascularity. No pneumothorax. No acute bony abnormality.  IMPRESSION: 1. Interval intubation with endotracheal tube in good anatomic position.  2. Diffuse pulmonary interstitial prominence remains and is unchanged. Findings are consistent with interstitial pulmonary edema or pneumonitis.   Electronically Signed   By: Maisie Fus  Register   On: 05/24/2013 15:58   Portable Chest Xray In Am  05/25/2013   CLINICAL DATA:  Respiratory failure  EXAM: PORTABLE CHEST - 1 VIEW  COMPARISON:  05/24/2013  FINDINGS: Cardiomediastinal silhouette is stable. Endotracheal tube in place with tip 3.7 cm above the carina. Worsening mild interstitial prominence bilaterally suspicious for pneumonitis or edema. No segmental infiltrate.  IMPRESSION: Endotracheal tube in place. Worsening interstitial prominence bilaterally suspicious for edema or pneumonitis. No segmental infiltrate.   Electronically Signed   By: Natasha Mead M.D.   On:  05/25/2013 07:35   Dg Chest Port 1 View  05/24/2013   CLINICAL DATA:  Altered mental status, hypoxia.  EXAM: PORTABLE CHEST - 1 VIEW  COMPARISON:  10/2011.  FINDINGS: Trachea is midline. Heart size normal. Mild diffuse interstitial prominence and indistinctness. Probable tiny bilateral effusions.  IMPRESSION: Question mild edema and tiny bilateral effusions.   Electronically Signed   By: Leanna Battles M.D.   On: 05/24/2013 14:00    Medications:  Scheduled: . albuterol  2.5 mg Nebulization Q6H   And  . ipratropium  0.5 mg Nebulization Q6H  . antiseptic oral rinse  15 mL Mouth Rinse QID  .  antiseptic oral rinse  15 mL Mouth Rinse QID  . chlorhexidine  15 mL Mouth Rinse BID  . enoxaparin (LOVENOX) injection  40 mg Subcutaneous Q24H  . fentaNYL  50 mcg Intravenous Once  . metoprolol tartrate  50 mg Per Tube BID  . pantoprazole (PROTONIX) IV  40 mg Intravenous QHS  . piperacillin-tazobactam (ZOSYN)  IV  3.375 g Intravenous Q8H  . vancomycin  1,000 mg Intravenous Q24H   Continuous: . fentaNYL infusion INTRAVENOUS 40 mcg/hr (05/26/13 0730)   YQM:[id number], fentaNYL  Assesment: She was admitted with acute on chronic respiratory failure. She has purulent secretions and fever and I think she has pneumonia which may be multifocal. She also has evidence of CHF which I think is diastolic. She is generally improved and I think she may be able to come off the ventilator today Principal Problem:   Respiratory failure, acute-on-chronic Active Problems:   Dyspnea   Hypertension   COPD (chronic obstructive pulmonary disease)   Chronic pain syndrome   Anxiety   Oxygen dependent   COPD exacerbation   PNA (pneumonia)   UTI (urinary tract infection)    Plan: Attempt weaning and extubation    LOS: 2 days   Ewell Benassi L 05/26/2013, 8:44 AM

## 2013-05-26 NOTE — Progress Notes (Signed)
Pt sitting up in chair and states that she is feeling nauseated. States she normally takes something at home for nausea. Dr Irene Limbo paged and made aware. Will continue to monitor.

## 2013-05-26 NOTE — Progress Notes (Signed)
Pt was extubated to a 50% VM. FV 610, NIF -50, Leak test passed, and she followed directions. Dr. Juanetta Gosling was called and he said to extubate. Pt is able to cough and is very alert. RT will decrease o2 to keep SATS greater than 90%.

## 2013-05-26 NOTE — Clinical Documentation Improvement (Signed)
Please clarify acuity of CHF if appropriate. Thank you.  Possible Clinical Conditions?  Chronic Diastolic Congestive Heart Failure Acute Diastolic Congestive Heart Failure Acute on Chronic Diastolic Congestive Heart Failure Other Condition________________________________________ Cannot Clinically Determine  Supporting Information:  Risk Factors: Chief complaint includes weakness, cough, and respiratory distress History of Hypertension and COPD Lungs: diminished breath sounds bilaterally and rhonchi bibasilar also mild crackles bilaterally Per H&P: Lungs: diminished breath sounds bilaterally and rhonchi bibasilar also mild crackles bilaterally. ???may also have chf component. Ck echo and serial enzymes, gentle ivf overnight. Per progress note 12/17:  She also has evidence of CHF which I think is diastolic  Signs & Symptoms: Cough Rales Rhonchi ?pulmonary edema  Diagnostics: BNP: 05/24/13 = 5934 05/25/13 = 3740 12/16 Echo: EF 55-60% chest x-ray is also suggestive of pulmonary edema  Treatment: Lasix IV 20mg  x 1 dose Echocardiogram BNP monitored IV fluids discontinued  Thank You, Harless Litten ,RN Clinical Documentation Specialist:  [phone]  [facility]- Health Information Management

## 2013-05-26 NOTE — Progress Notes (Signed)
Dr Juanetta Gosling paged with pre-extubation ABG results and extubation parameters. Order received to extubate pt. Will continue to monitor.

## 2013-05-26 NOTE — Clinical Social Work Note (Signed)
CSW unable to assess patient due to patient being in treatment, CSW returned and found patient asleep.  Will return to assess tomorrow.  Santa Genera, LCSW Clinical Social Worker [phone])

## 2013-05-26 NOTE — Progress Notes (Signed)
[facility] PROGRESS NOTE  Brittney West WUJ:[id number] DOB: Jan 24, 1946 DOA: 05/24/2013 PCP: Ernestine Conrad, MD  Assessment/Plan: 1. Acute on chronic hypoxic respiratory failure, ventilator dependent, appears stable, hopefully can extubate today 2. Possible community-acquired pneumonia, repeat chest x-ray yesterday looked worse, febrile last night, continue treatment for pneumonia. 3. COPD without evidence of exacerbation, appears stable 4. Acute encephalopathy, appears resolved 5. Acute renal insufficiency likely secondary to diuresis 6. Possible pulmonary edema, acute diastolic heart failure, now resolved status post diuresis 7. Vomiting, diarrhea; significance unclear. Resolved. Etiology unclear. 8. Positive troponin, repeat troponins negative. Secondary to strain. No further evaluation suggested. Echocardiogram reassuring. 9. Chronic pain syndrome, stable 10. Anxiety, stable 11. Social: Patient's husband died a few weeks ago and she has subsequently been living with a cousin without her usual oxygen.   Hopefully can extubate today, appreciate pulmonology recommendations in regard to ventilator management  Continue empiric antibiotics, followup respiratory culture  Repeat CBC and basic metabolic panel in the morning,  Pending studies:   Blood cultures no growth to date  Legionella antigen  Respiratory culture  Code Status: full code DVT prophylaxis: Lovenox Family Communication:  Disposition Plan: pending further evaluation  Brendia Sacks, MD  [facility]  Pager [phone] If 7PM-7AM, please contact night-coverage at [email or web address], password [facility] 05/26/2013, 8:17 AM  LOS: 2 days   Summary: 67 year old woman with chronic respiratory failure oxygen dependent secondary to COPD presents the emergency department with shortness of breath. Found to be in respiratory distress, ABG revealed respiratory acidosis and patient was intubated in the emergency department.  Her husband died a few weeks ago she was evicted from her apartment and has not been able to take her oxygen with her. Off oxygen for approximately one week.  Consultants:  Pulmonology  Procedures:  ETT 12/15 >>   Echocardiogram left ventricular ejection fraction 55-60%. Grade 1 diastolic dysfunction.  Antibiotics:  Zosyn 12/15 >>   Vancomycin 12/15 >>   HPI/Subjective: No issues overnight per RN, doing well on vent, awake and alert on fentanyl infusion. Febrile overnight.  Objective: Filed Vitals:   05/26/13 0500 05/26/13 0530 05/26/13 0706 05/26/13 0800  BP: 108/62 97/55  96/82  Pulse: 78 76  83  Temp:    98.6 F (37 C)  TempSrc:    Axillary  Resp: 15 17  20   Height:      Weight:      SpO2: 97% 96% 95% 95%    Intake/Output Summary (Last 24 hours) at 05/26/13 0817 Last data filed at 05/26/13 0500  Gross per 24 hour  Intake 1454.48 ml  Output   1700 ml  Net -245.52 ml     Filed Weights   05/24/13 1520 05/24/13 1853 05/25/13 0500  Weight: 57.607 kg (127 lb) 50 kg (110 lb 3.7 oz) 54.4 kg (119 lb 14.9 oz)    Exam:   Febrile 101.4, VSS  General: appears calm and comfortable on vent, non-toxic. Interacts with examiner.  Eyes: Appear unremarkable  ENT: Appears grossly normal  Cardiovascular: Regular rate and rhythm. No murmur, rub or gallop. No lower extremity edema.  Respiratory: Appears calm and comfortable on vent. Normal respiratory effort. Lungs clear to auscultation bilaterally. FiO2 40%  Abdomen soft  Skin: No rash or induration seen  Moves all extremities  Data Reviewed:  +395  Creatinine increased to 1.43 status post diuresis  Potassium 3.3  Echocardiogram noted  Scheduled Meds: . albuterol  2.5 mg Nebulization Q6H   And  . ipratropium  0.5 mg  Nebulization Q6H  . antiseptic oral rinse  15 mL Mouth Rinse QID  . antiseptic oral rinse  15 mL Mouth Rinse QID  . chlorhexidine  15 mL Mouth Rinse BID  . enoxaparin (LOVENOX) injection   40 mg Subcutaneous Q24H  . fentaNYL  50 mcg Intravenous Once  . metoprolol tartrate  50 mg Per Tube BID  . pantoprazole (PROTONIX) IV  40 mg Intravenous QHS  . piperacillin-tazobactam (ZOSYN)  IV  3.375 g Intravenous Q8H  . vancomycin  1,000 mg Intravenous Q24H   Continuous Infusions: . fentaNYL infusion INTRAVENOUS 40 mcg/hr (05/26/13 0730)    Principal Problem:   Respiratory failure, acute-on-chronic Active Problems:   Dyspnea   Hypertension   COPD (chronic obstructive pulmonary disease)   Chronic pain syndrome   Anxiety   Oxygen dependent   COPD exacerbation   PNA (pneumonia)   UTI (urinary tract infection)   Time spent 20 minutes

## 2013-05-27 DIAGNOSIS — Z9981 Dependence on supplemental oxygen: Secondary | ICD-10-CM

## 2013-05-27 LAB — CULTURE, RESPIRATORY

## 2013-05-27 LAB — BASIC METABOLIC PANEL
CO2: 30 mEq/L (ref 19–32)
Calcium: 8.5 mg/dL (ref 8.4–10.5)
Chloride: 96 mEq/L (ref 96–112)
Glucose, Bld: 95 mg/dL (ref 70–99)
Potassium: 3.1 mEq/L — ABNORMAL LOW (ref 3.5–5.1)
Sodium: 138 mEq/L (ref 135–145)

## 2013-05-27 LAB — CULTURE, RESPIRATORY W GRAM STAIN

## 2013-05-27 MED ORDER — POTASSIUM CHLORIDE CRYS ER 20 MEQ PO TBCR
40.0000 meq | EXTENDED_RELEASE_TABLET | Freq: Once | ORAL | Status: AC
  Administered 2013-05-27: 40 meq via ORAL
  Filled 2013-05-27: qty 2

## 2013-05-27 MED ORDER — POTASSIUM CHLORIDE CRYS ER 20 MEQ PO TBCR: 40.0000 meq | EXTENDED_RELEASE_TABLET | Freq: Once | ORAL | Status: DC

## 2013-05-27 MED ORDER — ACETAMINOPHEN 325 MG PO TABS
650.0000 mg | ORAL_TABLET | Freq: Four times a day (QID) | ORAL | Status: DC | PRN
  Administered 2013-05-27 – 2013-05-28 (×2): 650 mg via ORAL
  Filled 2013-05-27 (×3): qty 2

## 2013-05-27 NOTE — Clinical Social Work Psychosocial (Signed)
    Clinical Social Work Department BRIEF PSYCHOSOCIAL ASSESSMENT 05/27/2013  Patient:  Brittney West, Brittney West     Account Number:  [id number]     Admit date:  05/24/2013  Clinical Social Worker:  Santa Genera, CLINICAL SOCIAL WORKER  Date/Time:  05/27/2013 11:00 AM  Referred by:  Physician  Date Referred:  05/27/2013 Referred for  SNF Placement   Other Referral:   Interview type:  Patient Other interview type:    PSYCHOSOCIAL DATA Living Status:  FAMILY Admitted from facility:   Level of care:   Primary support name:  Renelda Mom Primary support relationship to patient:  FAMILY Degree of support available:   Cousin lives in home and helps take care of patient, is unable to provide level of care needed at discharge, patient says she has no other family or friends involved in her care    CURRENT CONCERNS Current Concerns  Post-Acute Placement   Other Concerns:    SOCIAL WORK ASSESSMENT / PLAN CSW met w patient at bedside, patient oriented x4, frequently coughing, did not want to talk much due to her physical condition.  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  Cousin ("much younger than me") lives w patient and assists w her care. As patient has declined physically, cousin is no longer able to provide care at discharge.  Patient says she has no one who helps her make decisions, makes her own decisions.    Discussed PT recommendation for SNF w patient, patient agreeable to bed search.  Wants to remain in [place] so cousin can visit.  Discussed copays associated w her policy, will keep patient advised on bed search process.   Assessment/plan status:  Psychosocial Support/Ongoing Assessment of Needs Other assessment/ plan:   Information/referral to community resources:   [facility] list    PATIENT'S/FAMILY'S RESPONSE TO PLAN OF CARE: Patient reluctantly agreeable to SNF placement at discharge "if I have to."        Santa Genera, LCSW Clinical Social Worker [phone])

## 2013-05-27 NOTE — Progress Notes (Signed)
[facility] PROGRESS NOTE  Brittney West [id number]:[id number] DOB: Jan 27, 1946 DOA: 05/24/2013 PCP: Ernestine Conrad, MD  Summary: 67 year old woman with chronic respiratory failure oxygen dependent secondary to COPD presented to the emergency department with shortness of breath. Found to be in respiratory distress, ABG revealed respiratory acidosis and patient was intubated in the emergency department. Her husband died a few weeks ago she was evicted from her apartment and has not been able to take her oxygen with her. Off oxygen for approximately one week.  She was successfully extubated and continues on antibiotics for presumed community acquired pneumonia and nasal cannula oxygen for chronic respiratory failure. Anticipate discharge within the next 3-4 days  Assessment/Plan: 1. Acute on chronic hypoxic respiratory failure, required intubation and mechanical ventilation on admission, subsequently extubated 12/17 and currently stable on nasal cannula. 2. Suspected community-acquired pneumonia, now afebrile 24 hours; sputum and blood cultures pending 3. COPD without evidence of exacerbation, stable 4. Acute encephalopathy, resolved; secondary to pneumonia and respiratory failure 5. Acute renal insufficiency secondary to diuresis, resolved 6. Acute diastolic heart failure, now resolved status post diuresis 7. Vomiting, diarrhea; significance unclear. Resolved. Suspect secondary to acute illness 8. Positive troponin, repeat troponins negative. Secondary to strain. No further evaluation suggested. Echocardiogram reassuring. 9. Chronic pain syndrome, stable 10. Anxiety, stable 11. Social: Per chart: Patient's husband died a few weeks ago and she has subsequently been living with a cousin without her usual oxygen--although she denies being on oxygen   Continue empiric antibiotics, followup cultures  Physical therapy consultation, assistance  Transfer to medical floor  Pending studies:    Blood cultures no growth to date  Respiratory culture  Code Status: full code DVT prophylaxis: Lovenox Family Communication: None present Disposition Plan: pending further evaluation  Brendia Sacks, MD  [facility]  Pager [phone] If 7PM-7AM, please contact night-coverage at [email or web address], password [facility] 05/27/2013, 9:48 AM  LOS: 3 days   Consultants:  Pulmonology  Procedures:  ETT 12/15 >> 12/17  Echocardiogram left ventricular ejection fraction 55-60%. Grade 1 diastolic dysfunction.  Antibiotics:  Zosyn 12/15 >>   Vancomycin 12/15 >>   Azithromycin 12/17 >>   HPI/Subjective: Stable overnight status post extubation yesterday. Short of breath when no new issues. She denies been on oxygen at home.  Objective: Filed Vitals:   05/27/13 0400 05/27/13 0500 05/27/13 0600 05/27/13 0709  BP: 108/54 96/57 104/47   Pulse: 78 81 74   Temp: 97.9 F (36.6 C)     TempSrc: Axillary     Resp: 20 19 19    Height:      Weight:  53.6 kg (118 lb 2.7 oz)    SpO2: 95% 96% 96% 96%    Intake/Output Summary (Last 24 hours) at 05/27/13 0948 Last data filed at 05/27/13 0127  Gross per 24 hour  Intake    520 ml  Output    550 ml  Net    -30 ml     Filed Weights   05/24/13 1853 05/25/13 0500 05/27/13 0500  Weight: 50 kg (110 lb 3.7 oz) 54.4 kg (119 lb 14.9 oz) 53.6 kg (118 lb 2.7 oz)    Exam:   Afebrile, vital signs stable. Hypoxia stable on 3 L  Data Reviewed:  +<500 since admission  Creatinine 1.43 >> 1.20  Potassium 3.1  Scheduled Meds: . acetylcysteine  3 mL Nebulization Q4H  . albuterol  2.5 mg Nebulization Q4H   And  . ipratropium  0.5 mg Nebulization Q4H  . antiseptic oral rinse  15 mL Mouth Rinse QID  . azithromycin  500 mg Intravenous Q24H  . chlorhexidine  15 mL Mouth Rinse BID  . enoxaparin (LOVENOX) injection  40 mg Subcutaneous Q24H  . guaiFENesin  1,200 mg Oral BID  . influenza vac split quadrivalent PF  0.5 mL Intramuscular  Tomorrow-1000  . metoprolol tartrate  50 mg Oral BID  . pantoprazole (PROTONIX) IV  40 mg Intravenous QHS  . piperacillin-tazobactam (ZOSYN)  IV  3.375 g Intravenous Q8H  . pneumococcal 23 valent vaccine  0.5 mL Intramuscular Tomorrow-1000  . potassium chloride  40 mEq Oral Once  . vancomycin  1,000 mg Intravenous Q24H   Continuous Infusions:    Principal Problem:   Respiratory failure, acute-on-chronic Active Problems:   Dyspnea   Hypertension   COPD (chronic obstructive pulmonary disease)   Chronic pain syndrome   Anxiety   Oxygen dependent   COPD exacerbation   PNA (pneumonia)   UTI (urinary tract infection)   Time spent 25 minutes

## 2013-05-27 NOTE — Clinical Social Work Placement (Signed)
    Clinical Social Work Department CLINICAL SOCIAL WORK PLACEMENT NOTE 05/31/2013  Patient:  Brittney West, Brittney West  Account Number:  [id number] Admit date:  05/24/2013  Clinical Social Worker:  Santa Genera, CLINICAL SOCIAL WORKER  Date/time:  05/27/2013 11:00 AM  Clinical Social Work is seeking post-discharge placement for this patient at the following level of care:   SKILLED NURSING   (*CSW will update this form in Epic as items are completed)   05/27/2013  Patient/family provided with [facility] Department of Clinical Social Work's list of facilities offering this level of care within the geographic area requested by the patient (or if unable, by the patient's family).  05/27/2013  Patient/family informed of their freedom to choose among providers that offer the needed level of care, that participate in Medicare, Medicaid or managed care program needed by the patient, have an available bed and are willing to accept the patient.  05/27/2013  Patient/family informed of [facility]' ownership interest in [facility], as well as of the fact that they are under no obligation to receive care at this facility.  PASARR submitted to EDS on 05/27/2013 PASARR number received from EDS on 05/27/2013  FL2 transmitted to all facilities in geographic area requested by pt/family on  05/27/2013 FL2 transmitted to all facilities within larger geographic area on   Patient informed that his/her managed care company has contracts with or will negotiate with  certain facilities, including the following:     Patient/family informed of bed offers received:  05/28/2013 Patient chooses bed at [facility] Physician recommends and patient chooses bed at    Patient to be transferred to [facility] on  05/31/2013 Patient to be transferred to facility by [facility] staff via tunnel  The following physician request were entered in Epic:   Additional Comments:  Santa Genera,  LCSW Clinical Social Worker [phone])

## 2013-05-27 NOTE — Progress Notes (Signed)
Pt refused both pneumonia and flu vaccine.  Spoke with pt about risk of not receiving vaccines and pt continues to refuse.

## 2013-05-27 NOTE — Progress Notes (Signed)
Subjective: She was able to be successfully extubated yesterday. She says she still short of breath and coughing.  Objective: Vital signs in last 24 hours: Temp:  [97.9 F (36.6 C)-98.4 F (36.9 C)] 97.9 F (36.6 C) (12/18 0400) Pulse Rate:  [30-99] 74 (12/18 0600) Resp:  [13-28] 19 (12/18 0600) BP: (82-168)/(39-148) 104/47 mmHg (12/18 0600) SpO2:  [88 %-100 %] 96 % (12/18 0709) Weight:  [53.6 kg (118 lb 2.7 oz)] 53.6 kg (118 lb 2.7 oz) (12/18 0500) Weight change:  Last BM Date:  (unsure)  Intake/Output from previous day: 12/17 0701 - 12/18 0700 In: 580 [P.O.:220; I.V.:10; IV Piggyback:350] Out: 550 [Urine:550]  PHYSICAL EXAM General appearance: alert, cooperative and moderate distress Resp: rales bilaterally and rhonchi bilaterally Cardio: regular rate and rhythm, S1, S2 normal, no murmur, click, rub or gallop GI: soft, non-tender; bowel sounds normal; no masses,  no organomegaly Extremities: extremities normal, atraumatic, no cyanosis or edema  Lab Results:    Basic Metabolic Panel:  Recent Labs  29/56/21 0437 05/27/13 0415  NA 142 138  K 3.3* 3.1*  CL 101 96  CO2 30 30  GLUCOSE 114* 95  BUN 22 19  CREATININE 1.43* 1.20*  CALCIUM 8.1* 8.5   Liver Function Tests:  Recent Labs  05/24/13 1247  AST 23  ALT 15  ALKPHOS 166*  BILITOT 0.3  PROT 8.0  ALBUMIN 3.4*    Recent Labs  05/24/13 1247  LIPASE 16   No results found for this basename: AMMONIA,  in the last 72 hours CBC:  Recent Labs  05/24/13 1245 05/25/13 0419  WBC 14.0* 8.6  NEUTROABS  --  6.7  HGB 13.7 11.1*  HCT 41.5 33.5*  MCV 94.7 95.2  PLT 292 179   Cardiac Enzymes:  Recent Labs  05/24/13 2009 05/25/13 0142 05/25/13 0802  TROPONINI <0.30 <0.30 <0.30   BNP:  Recent Labs  05/24/13 1245 05/25/13 0802  PROBNP 5934.0* 3740.0*   D-Dimer: No results found for this basename: DDIMER,  in the last 72 hours CBG:  Recent Labs  05/24/13 1239 05/24/13 2101 05/25/13 0011  05/26/13 0734  GLUCAP 133* 100* 94 106*   Hemoglobin A1C: No results found for this basename: HGBA1C,  in the last 72 hours Fasting Lipid Panel: No results found for this basename: CHOL, HDL, LDLCALC, TRIG, CHOLHDL, LDLDIRECT,  in the last 72 hours Thyroid Function Tests: No results found for this basename: TSH, T4TOTAL, FREET4, T3FREE, THYROIDAB,  in the last 72 hours Anemia Panel: No results found for this basename: VITAMINB12, FOLATE, FERRITIN, TIBC, IRON, RETICCTPCT,  in the last 72 hours Coagulation: No results found for this basename: LABPROT, INR,  in the last 72 hours Urine Drug Screen: Drugs of Abuse     Component Value Date/Time   LABOPIA NONE DETECTED 05/24/2013 1428   COCAINSCRNUR NONE DETECTED 05/24/2013 1428   LABBENZ POSITIVE* 05/24/2013 1428   AMPHETMU NONE DETECTED 05/24/2013 1428   THCU NONE DETECTED 05/24/2013 1428   LABBARB NONE DETECTED 05/24/2013 1428    Alcohol Level:  Recent Labs  05/24/13 1247  ETH <11   Urinalysis:  Recent Labs  05/24/13 1656  COLORURINE YELLOW  LABSPEC >1.030*  PHURINE 6.0  GLUCOSEU NEGATIVE  HGBUR LARGE*  BILIRUBINUR NEGATIVE  KETONESUR 15*  PROTEINUR 100*  UROBILINOGEN 0.2  NITRITE POSITIVE*  LEUKOCYTESUR NEGATIVE   Misc. Labs:  ABGS  Recent Labs  05/26/13 0855  PHART 7.319*  PO2ART 83.5  TCO2 29.7  HCO3 31.9*   CULTURES  Recent Results (from the past 240 hour(s))  MRSA PCR SCREENING     Status: None   Collection Time    05/24/13  6:41 PM      Result Value Range Status   MRSA by PCR NEGATIVE  NEGATIVE Final   Comment:            The GeneXpert MRSA Assay (FDA     approved for NASAL specimens     only), is one component of a     comprehensive MRSA colonization     surveillance program. It is not     intended to diagnose MRSA     infection nor to guide or     monitor treatment for     MRSA infections.  CULTURE, RESPIRATORY (NON-EXPECTORATED)     Status: None   Collection Time    05/24/13  7:38 PM       Result Value Range Status   Specimen Description TRACHEAL ASPIRATE   Final   Special Requests NONE   Final   Gram Stain     Final   Value: ABUNDANT WBC PRESENT, PREDOMINANTLY PMN     NO SQUAMOUS EPITHELIAL CELLS SEEN     MODERATE GRAM NEGATIVE COCCOBACILLI     Performed at [facility]   Culture     Final   Value: Culture reincubated for better growth     Performed at [facility]   Report Status PENDING   Incomplete  RESPIRATORY VIRUS PANEL     Status: None   Collection Time    05/24/13  8:02 PM      Result Value Range Status   Source - RVPAN NASAL WASHINGS   Corrected   Comment: CORRECTED ON 12/17 AT 1908: PREVIOUSLY REPORTED AS NASAL WASHINGS   Respiratory Syncytial Virus A NOT DETECTED   Final   Respiratory Syncytial Virus B NOT DETECTED   Final   Influenza A NOT DETECTED   Final   Influenza B NOT DETECTED   Final   Parainfluenza 1 NOT DETECTED   Final   Parainfluenza 2 NOT DETECTED   Final   Parainfluenza 3 NOT DETECTED   Final   Metapneumovirus NOT DETECTED   Final   Rhinovirus NOT DETECTED   Final   Adenovirus NOT DETECTED   Final   Influenza A H1 NOT DETECTED   Final   Influenza A H3 NOT DETECTED   Final   Comment: (NOTE)           Normal Reference Range for each Analyte: NOT DETECTED     Testing performed using the Luminex xTAG Respiratory Viral Panel test     kit.     This test was developed and its performance characteristics determined     by [facility]. It has not been cleared or approved by the [place]     Food and Drug Administration. This test is used for clinical purposes.     It should not be regarded as investigational or for research. This     laboratory is certified under the Clinical Laboratory Improvement     Amendments of 1988 (CLIA) as qualified to perform high complexity     clinical laboratory testing.     Performed at [facility]  CULTURE, BLOOD (ROUTINE X 2)     Status: None   Collection Time    05/24/13   8:09 PM      Result Value Range Status   Specimen Description BLOOD RIGHT  ARM   Final   Special Requests BOTTLES DRAWN AEROBIC AND ANAEROBIC 6CC BOTTLES   Final   Culture NO GROWTH 2 DAYS   Final   Report Status PENDING   Incomplete  CULTURE, BLOOD (ROUTINE X 2)     Status: None   Collection Time    05/24/13  8:09 PM      Result Value Range Status   Specimen Description BLOOD RIGHT HAND   Final   Special Requests BOTTLES DRAWN AEROBIC AND ANAEROBIC 6CC BOTTLES   Final   Culture NO GROWTH 2 DAYS   Final   Report Status PENDING   Incomplete   Studies/Results: No results found.  Medications:  Prior to Admission:  Prescriptions prior to admission  Medication Sig Dispense Refill  . amoxicillin (AMOXIL) 875 MG tablet Take 875 mg by mouth 3 (three) times daily. 10 day course stating on 05/10/2013      . COMBIVENT RESPIMAT 20-100 MCG/ACT AERS respimat Inhale 1 puff into the lungs every 4 (four) hours as needed. For shortness of breath/wheezing      . diazepam (VALIUM) 10 MG tablet Take 10 mg by mouth 3 (three) times daily as needed. For anxiety      . ENDOCET 10-325 MG per tablet Take 1 tablet by mouth 4 (four) times daily as needed. For pain      . gabapentin (NEURONTIN) 300 MG capsule Take 300 mg by mouth 3 (three) times daily.      . metoprolol succinate (TOPROL-XL) 50 MG 24 hr tablet Take 50 mg by mouth daily.      Marland Kitchen omeprazole (PRILOSEC) 40 MG capsule Take 40 mg by mouth daily.      . ondansetron (ZOFRAN) 4 MG tablet Take 4 mg by mouth 3 (three) times daily as needed. For nausea and/or vomiting       Scheduled: . acetylcysteine  3 mL Nebulization Q4H  . albuterol  2.5 mg Nebulization Q4H   And  . ipratropium  0.5 mg Nebulization Q4H  . antiseptic oral rinse  15 mL Mouth Rinse QID  . azithromycin  500 mg Intravenous Q24H  . chlorhexidine  15 mL Mouth Rinse BID  . enoxaparin (LOVENOX) injection  40 mg Subcutaneous Q24H  . guaiFENesin  1,200 mg Oral BID  . influenza vac split  quadrivalent PF  0.5 mL Intramuscular Tomorrow-1000  . metoprolol tartrate  50 mg Oral BID  . pantoprazole (PROTONIX) IV  40 mg Intravenous QHS  . piperacillin-tazobactam (ZOSYN)  IV  3.375 g Intravenous Q8H  . pneumococcal 23 valent vaccine  0.5 mL Intramuscular Tomorrow-1000  . potassium chloride  40 mEq Oral Once  . vancomycin  1,000 mg Intravenous Q24H   Continuous:  YNW:[id number], albuterol, ondansetron (ZOFRAN) IV  Assesment: She had acute on chronic respiratory failure probably from pneumonia. There was some element of volume overload as well. She was able to be extubated yesterday and has done okay but is still short of breath and coughing. Principal Problem:   Respiratory failure, acute-on-chronic Active Problems:   Dyspnea   Hypertension   COPD (chronic obstructive pulmonary disease)   Chronic pain syndrome   Anxiety   Oxygen dependent   COPD exacerbation   PNA (pneumonia)   UTI (urinary tract infection)    Plan: No change in treatments.    LOS: 3 days   Shannan Garfinkel L 05/27/2013, 8:54 AM

## 2013-05-27 NOTE — Progress Notes (Signed)
ANTIBIOTIC CONSULT NOTE - follow up  Pharmacy Consult for Vancomycin, Zosyn Indication: rule out pneumonia  Allergies  Allergen Reactions  . Advair Diskus [Fluticasone-Salmeterol]     Told by GI not to take.  [name] [Aspirin]     Due to GI problems  . Codeine     GI upset  . Nsaids     tigger migraine  . Other     PO/IV steroids--told by GI not to take.  . Stadol [Butorphanol]     Mental status changes  . Xanax [Alprazolam]     Told by GI not to take   Patient Measurements: Height: 5\' 4"  (162.6 cm) Weight: 118 lb 2.7 oz (53.6 kg) IBW/kg (Calculated) : 54.7  Vital Signs: Temp: 98.1 F (36.7 C) (12/18 0800) Temp src: Axillary (12/18 0800) BP: 104/47 mmHg (12/18 0600) Pulse Rate: 39 (12/18 0800) Intake/Output from previous day: 12/17 0701 - 12/18 0700 In: 580 [P.O.:220; I.V.:10; IV Piggyback:350] Out: 550 [Urine:550] Intake/Output from this shift:    Labs:  Recent Labs  05/24/13 1245 05/25/13 0419 05/26/13 0437 05/27/13 0415  WBC 14.0* 8.6  --   --   HGB 13.7 11.1*  --   --   PLT 292 179  --   --   CREATININE 0.77 0.90 1.43* 1.20*   Estimated Creatinine Clearance: 39.5 ml/min (by C-G formula based on Cr of 1.2). No results found for this basename: VANCOTROUGH, Leodis Binet, VANCORANDOM, GENTTROUGH, GENTPEAK, GENTRANDOM, TOBRATROUGH, TOBRAPEAK, TOBRARND, AMIKACINPEAK, AMIKACINTROU, AMIKACIN,  in the last 72 hours   Microbiology: Recent Results (from the past 720 hour(s))  MRSA PCR SCREENING     Status: None   Collection Time    05/24/13  6:41 PM      Result Value Range Status   MRSA by PCR NEGATIVE  NEGATIVE Final   Comment:            The GeneXpert MRSA Assay (FDA     approved for NASAL specimens     only), is one component of a     comprehensive MRSA colonization     surveillance program. It is not     intended to diagnose MRSA     infection nor to guide or     monitor treatment for     MRSA infections.  CULTURE, RESPIRATORY (NON-EXPECTORATED)      Status: None   Collection Time    05/24/13  7:38 PM      Result Value Range Status   Specimen Description TRACHEAL ASPIRATE   Final   Special Requests NONE   Final   Gram Stain     Final   Value: ABUNDANT WBC PRESENT, PREDOMINANTLY PMN     NO SQUAMOUS EPITHELIAL CELLS SEEN     MODERATE GRAM NEGATIVE COCCOBACILLI     Performed at [facility]   Culture     Final   Value: Culture reincubated for better growth     Performed at [facility]   Report Status PENDING   Incomplete  RESPIRATORY VIRUS PANEL     Status: None   Collection Time    05/24/13  8:02 PM      Result Value Range Status   Source - RVPAN NASAL WASHINGS   Corrected   Comment: CORRECTED ON 12/17 AT 1908: PREVIOUSLY REPORTED AS NASAL WASHINGS   Respiratory Syncytial Virus A NOT DETECTED   Final   Respiratory Syncytial Virus B NOT DETECTED   Final   Influenza A NOT DETECTED  Final   Influenza B NOT DETECTED   Final   Parainfluenza 1 NOT DETECTED   Final   Parainfluenza 2 NOT DETECTED   Final   Parainfluenza 3 NOT DETECTED   Final   Metapneumovirus NOT DETECTED   Final   Rhinovirus NOT DETECTED   Final   Adenovirus NOT DETECTED   Final   Influenza A H1 NOT DETECTED   Final   Influenza A H3 NOT DETECTED   Final   Comment: (NOTE)           Normal Reference Range for each Analyte: NOT DETECTED     Testing performed using the Luminex xTAG Respiratory Viral Panel test     kit.     This test was developed and its performance characteristics determined     by [facility]. It has not been cleared or approved by the [place]     Food and Drug Administration. This test is used for clinical purposes.     It should not be regarded as investigational or for research. This     laboratory is certified under the Clinical Laboratory Improvement     Amendments of 1988 (CLIA) as qualified to perform high complexity     clinical laboratory testing.     Performed at [facility]  CULTURE, BLOOD (ROUTINE X 2)      Status: None   Collection Time    05/24/13  8:09 PM      Result Value Range Status   Specimen Description BLOOD RIGHT ARM   Final   Special Requests BOTTLES DRAWN AEROBIC AND ANAEROBIC 6CC BOTTLES   Final   Culture NO GROWTH 2 DAYS   Final   Report Status PENDING   Incomplete  CULTURE, BLOOD (ROUTINE X 2)     Status: None   Collection Time    05/24/13  8:09 PM      Result Value Range Status   Specimen Description BLOOD RIGHT HAND   Final   Special Requests BOTTLES DRAWN AEROBIC AND ANAEROBIC 6CC BOTTLES   Final   Culture NO GROWTH 2 DAYS   Final   Report Status PENDING   Incomplete   Medical History: Past Medical History  Diagnosis Date  . Hypertension   . COPD (chronic obstructive pulmonary disease)   . Oxygen dependent   . Hypoxia   . Peptic ulcer   . DDD (degenerative disc disease)   . Osteoporosis   . Osteoarthritis   . Chronic pain syndrome   . Back pain   . Hip pain, bilateral   . Wrist pain   . Anxiety    Medications:  Scheduled:  . acetylcysteine  3 mL Nebulization Q4H  . albuterol  2.5 mg Nebulization Q4H   And  . ipratropium  0.5 mg Nebulization Q4H  . antiseptic oral rinse  15 mL Mouth Rinse QID  . azithromycin  500 mg Intravenous Q24H  . chlorhexidine  15 mL Mouth Rinse BID  . enoxaparin (LOVENOX) injection  40 mg Subcutaneous Q24H  . guaiFENesin  1,200 mg Oral BID  . influenza vac split quadrivalent PF  0.5 mL Intramuscular Tomorrow-1000  . metoprolol tartrate  50 mg Oral BID  . pantoprazole (PROTONIX) IV  40 mg Intravenous QHS  . piperacillin-tazobactam (ZOSYN)  IV  3.375 g Intravenous Q8H  . pneumococcal 23 valent vaccine  0.5 mL Intramuscular Tomorrow-1000  . potassium chloride  40 mEq Oral Once  . vancomycin  1,000 mg Intravenous Q24H  Assessment: 67yo female admitted with pneumonia.  Pt was extubated successfully yesterday.  SCR bumped up but is improved today.  Estimated Creatinine Clearance: 39.5 ml/min (by C-G formula based on Cr of  1.2).  Afebrile.  Zithromax 12/17 >> Zosyn 12/15 >> Vancomycin 12/15 >>  Goal of Therapy:  Vancomycin trough level 15-20 mcg/ml  Plan:  Zosyn 3.375 GM IV every 8 hours, infused over 4 hours Vancomycin 1 GM IV every 24 hours Vancomycin trough at steady state Monitor renal function and cultures Labs per protocol  Valrie Hart A 05/27/2013,10:23 AM

## 2013-05-27 NOTE — Evaluation (Signed)
Physical Therapy Evaluation Patient Details Name: Brittney West MRN: [id number] DOB: 16-Feb-1946 Today's Date: 05/27/2013 Time:  -     PT Assessment / Plan / Recommendation History of Present Illness  Pt is admitted due to repiratory failure.  She has chronic COPD and was intubated in the ER.  She was extubated yesterday.  She looks far older than her stated age but states that she had been independent with all ADLs PTA.    Clinical Impression   Pt was seen for evaluation.  She was alert but appeared to be confused at times and gave reports of her hx that are quite different than that reported in the chart  (i.e. She states that she does not use supplemental O2 at home).  She is generally deconditioned from this recent illness and now needs a walker to stabilize her gait.  Her standing balance is compromised and she tends to fall backward at times.  I am recommending SNF at d/c and pt seems agreeable to this.    PT Assessment  Patient needs continued PT services    Follow Up Recommendations  SNF    Does the patient have the potential to tolerate intense rehabilitation      Barriers to Discharge Decreased caregiver support it is not clear to me if there is any family support...pt states that cousin is "trying to get rid of me"    Equipment Recommendations  Rolling walker with 5" wheels    Recommendations for Other Services     Frequency Min 3X/week    Precautions / Restrictions Precautions Precautions: Fall Restrictions Weight Bearing Restrictions: No   Pertinent Vitals/Pain       Mobility  Bed Mobility Bed Mobility: Not assessed Transfers Transfers: Sit to Stand;Stand to Sit Sit to Stand: 4: Min guard;With upper extremity assist;From chair/3-in-1 Stand to Sit: 4: Min guard;To chair/3-in-1;With upper extremity assist Ambulation/Gait Ambulation/Gait Assistance: 5: Supervision Ambulation Distance (Feet): 100 Feet Assistive device: Rolling walker Gait Pattern: Trunk  flexed;Shuffle Gait velocity: WNL Stairs: No Wheelchair Mobility Wheelchair Mobility: No    Exercises     PT Diagnosis: Difficulty walking;Generalized weakness  PT Problem List: Decreased strength;Decreased activity tolerance;Decreased balance;Decreased mobility;Decreased knowledge of use of DME;Cardiopulmonary status limiting activity PT Treatment Interventions: Gait training;Therapeutic exercise;Patient/family education     PT Goals(Current goals can be found in the care plan section) Acute Rehab PT Goals Patient Stated Goal: none stated PT Goal Formulation: With patient Time For Goal Achievement: 06/10/13 Potential to Achieve Goals: Good  Visit Information  Last PT Received On: 05/27/13 History of Present Illness: Pt is admitted due to repiratory failure.  She has chronic COPD and was intubated in the ER.  She was extubated yesterday.  She looks far older than her stated age but states that she had been independent with all ADLs PTA.         Prior Functioning  Home Living Family/patient expects to be discharged to:: Skilled nursing facility Prior Function Level of Independence: Independent Communication Communication: No difficulties    Cognition  Cognition Arousal/Alertness: Awake/alert Behavior During Therapy: WFL for tasks assessed/performed Overall Cognitive Status: Within Functional Limits for tasks assessed (appears confused at times)    Extremity/Trunk Assessment Lower Extremity Assessment Lower Extremity Assessment: Generalized weakness   Balance Balance Balance Assessed: Yes Static Standing Balance Static Standing - Level of Assistance: 5: Stand by assistance Dynamic Standing Balance Dynamic Standing - Balance Support: No upper extremity supported Dynamic Standing - Level of Assistance: 4: Min assist (falls  backward)  End of Session PT - End of Session Equipment Utilized During Treatment: Gait belt Activity Tolerance: Patient limited by fatigue Patient  left: in chair;with call bell/phone within reach;with chair alarm set Nurse Communication: Mobility status  GP     Konrad Penta 05/27/2013, 11:16 AM

## 2013-05-28 LAB — BASIC METABOLIC PANEL
BUN: 12 mg/dL (ref 6–23)
Chloride: 96 mEq/L (ref 96–112)
GFR calc non Af Amer: 57 mL/min — ABNORMAL LOW (ref >90)
Glucose, Bld: 121 mg/dL — ABNORMAL HIGH (ref 70–99)
Potassium: 3 mEq/L — ABNORMAL LOW (ref 3.5–5.1)
Sodium: 138 mEq/L (ref 135–145)

## 2013-05-28 LAB — CBC
HCT: 33.2 % — ABNORMAL LOW (ref 36.0–46.0)
Hemoglobin: 10.6 g/dL — ABNORMAL LOW (ref 12.0–15.0)
MCH: 30.6 pg (ref 26.0–34.0)
Platelets: 177 10*3/uL (ref 150–400)
RBC: 3.46 MIL/uL — ABNORMAL LOW (ref 3.87–5.11)
WBC: 10.2 10*3/uL (ref 4.0–10.5)

## 2013-05-28 MED ORDER — ENSURE COMPLETE PO LIQD
237.0000 mL | Freq: Two times a day (BID) | ORAL | Status: DC
  Administered 2013-05-28 – 2013-05-31 (×7): 237 mL via ORAL

## 2013-05-28 MED ORDER — POTASSIUM CHLORIDE CRYS ER 20 MEQ PO TBCR
40.0000 meq | EXTENDED_RELEASE_TABLET | Freq: Once | ORAL | Status: AC
  Administered 2013-05-28: 40 meq via ORAL
  Filled 2013-05-28: qty 2

## 2013-05-28 MED ORDER — PANTOPRAZOLE SODIUM 40 MG PO TBEC
40.0000 mg | DELAYED_RELEASE_TABLET | Freq: Every day | ORAL | Status: DC
  Administered 2013-05-28 – 2013-05-31 (×4): 40 mg via ORAL
  Filled 2013-05-28 (×4): qty 1

## 2013-05-28 NOTE — Progress Notes (Signed)
PT TRANSFERING TO ROOM 329. PT ALERT AND ORIENTED TO SELF AND SITUATION. O2 AT 3L/MIN VIA [place] . O2 SAT 97% WHEN PT IS NOT TAKING OFF O2 TO BLOW HER NOSE. DIMINISHED BREATH SOUNDS,IV IN LT FOREARM PATENT. DENIES ANY DISCOMFORT OR DISTRESS, TRANSFER REPORT CALLED TO JESSICA B. RN ON 39=00.

## 2013-05-28 NOTE — Clinical Social Work Note (Signed)
Patient given bed offers, chooses [facility].  Facility admissions informed and agreeable.  Santa Genera, LCSW Clinical Social Worker [phone])

## 2013-05-28 NOTE — Progress Notes (Signed)
[facility] PROGRESS NOTE  Brittney West WGN:[id number] DOB: [birth year] DOA: 05/24/2013 PCP: Ernestine Conrad, MD  Summary: 67 year old woman with chronic respiratory failure oxygen dependent secondary to COPD presented to the emergency department with shortness of breath. Found to be in respiratory distress, ABG revealed respiratory acidosis and patient was intubated in the emergency department. Her husband died a few weeks ago she was evicted from her apartment and has not been able to take her oxygen with her. Off oxygen for approximately one week.  She was successfully extubated and continues on antibiotics for presumed community acquired pneumonia and nasal cannula oxygen for chronic respiratory failure. Anticipate discharge within the next 3-4 days  Assessment/Plan: 1. Acute on chronic hypoxic respiratory failure, required intubation and mechanical ventilation on admission, subsequently extubated 12/17 and currently stable on nasal cannula. 2. Suspected community-acquired pneumonia, now afebrile 24 hours; blood cultures have shown no growth, and sputum cultures are positive for H. Influenza.  Will discontinue vancomycin and zosyn, continue azithromycin 3. COPD without evidence of exacerbation, stable 4. Acute encephalopathy, resolved; secondary to pneumonia and respiratory failure 5. Acute renal insufficiency secondary to diuresis, resolved 6. Acute diastolic heart failure, now resolved status post diuresis 7. Vomiting, diarrhea; significance unclear. Resolved. Suspect secondary to acute illness 8. Positive troponin, repeat troponins negative. Secondary to strain. No further evaluation suggested. Echocardiogram reassuring. 9. Chronic pain syndrome, stable 10. Anxiety, stable 11. Social: Per chart: Patient's husband died a few weeks ago and she has subsequently been living with a cousin without her usual oxygen--although she denies being on oxygen  Code Status: full code DVT  prophylaxis: Lovenox Family Communication: None present Disposition Plan: will need SNF placement  Erick Blinks, MD  [facility]  Pager [phone] If 7PM-7AM, please contact night-coverage at [email or web address], password [facility] 05/28/2013, 10:48 AM  LOS: 4 days   Consultants:  Pulmonology  Procedures:  ETT 12/15 >> 12/17  Echocardiogram left ventricular ejection fraction 55-60%. Grade 1 diastolic dysfunction.  Antibiotics:  Zosyn 12/15 >> 12/19  Vancomycin 12/15 >> 12/19  Azithromycin 12/17 >>   HPI/Subjective: Feels weak and tired.  Has productive cough  Objective: Filed Vitals:   05/28/13 0745 05/28/13 0800 05/28/13 0815 05/28/13 0830  BP:      Pulse:      Temp:      TempSrc:      Resp:      Height:      Weight:      SpO2: 100% 96% 99% 100%    Intake/Output Summary (Last 24 hours) at 05/28/13 1048 Last data filed at 05/28/13 0836  Gross per 24 hour  Intake   1820 ml  Output   1150 ml  Net    670 ml     Filed Weights   05/25/13 0500 05/27/13 0500 05/28/13 0600  Weight: 54.4 kg (119 lb 14.9 oz) 53.6 kg (118 lb 2.7 oz) 53.8 kg (118 lb 9.7 oz)    Exam:   Afebrile, vital signs stable. Hypoxia stable on 3 L   Gen: NAD  Chest: coarse breath sounds bilaterally  Cardiac: s1., S2 RRR  Abdomen: soft, nt, nd, bs+  EXT: no edema b/l  Scheduled Meds: . acetylcysteine  3 mL Nebulization Q4H  . albuterol  2.5 mg Nebulization Q4H   And  . ipratropium  0.5 mg Nebulization Q4H  . antiseptic oral rinse  15 mL Mouth Rinse QID  . azithromycin  500 mg Intravenous Q24H  . chlorhexidine  15 mL Mouth Rinse BID  .  enoxaparin (LOVENOX) injection  40 mg Subcutaneous Q24H  . guaiFENesin  1,200 mg Oral BID  . influenza vac split quadrivalent PF  0.5 mL Intramuscular Tomorrow-1000  . metoprolol tartrate  50 mg Oral BID  . pantoprazole (PROTONIX) IV  40 mg Intravenous QHS  . piperacillin-tazobactam (ZOSYN)  IV  3.375 g Intravenous Q8H  . pneumococcal 23 valent  vaccine  0.5 mL Intramuscular Tomorrow-1000  . vancomycin  1,000 mg Intravenous Q24H   Continuous Infusions:    Principal Problem:   Respiratory failure, acute-on-chronic Active Problems:   Dyspnea   Hypertension   COPD (chronic obstructive pulmonary disease)   Chronic pain syndrome   Anxiety   Oxygen dependent   COPD exacerbation   PNA (pneumonia)   UTI (urinary tract infection)   Time spent 25 minutes

## 2013-05-28 NOTE — Progress Notes (Signed)
ANTIBIOTIC CONSULT NOTE  Pharmacy Consult for renal dose antibiotics Indication: PNA  Allergies  Allergen Reactions  . Advair Diskus [Fluticasone-Salmeterol]     Told by GI not to take.  [name] [Aspirin]     Due to GI problems  . Codeine     GI upset  . Nsaids     tigger migraine  . Other     PO/IV steroids--told by GI not to take.  . Stadol [Butorphanol]     Mental status changes  . Xanax [Alprazolam]     Told by GI not to take   Patient Measurements: Height: 5\' 4"  (162.6 cm) Weight: 118 lb 9.7 oz (53.8 kg) IBW/kg (Calculated) : 54.7  Vital Signs: Temp: 98.2 F (36.8 C) (12/19 0730) Temp src: Oral (12/19 0730) BP: 111/67 mmHg (12/19 0600) Intake/Output from previous day: 12/18 0701 - 12/19 0700 In: 1880 [P.O.:1030; IV Piggyback:850] Out: 1500 [Urine:1500] Intake/Output from this shift: Total I/O In: 290 [P.O.:240; IV Piggyback:50] Out: -   Labs:  Recent Labs  05/26/13 0437 05/27/13 0415 05/28/13 0427  WBC  --   --  10.2  HGB  --   --  10.6*  PLT  --   --  177  CREATININE 1.43* 1.20* 1.01   Estimated Creatinine Clearance: 47.2 ml/min (by C-G formula based on Cr of 1.01). No results found for this basename: VANCOTROUGH, Leodis Binet, VANCORANDOM, GENTTROUGH, GENTPEAK, GENTRANDOM, TOBRATROUGH, TOBRAPEAK, TOBRARND, AMIKACINPEAK, AMIKACINTROU, AMIKACIN,  in the last 72 hours   Microbiology: Recent Results (from the past 720 hour(s))  MRSA PCR SCREENING     Status: None   Collection Time    05/24/13  6:41 PM      Result Value Range Status   MRSA by PCR NEGATIVE  NEGATIVE Final   Comment:            The GeneXpert MRSA Assay (FDA     approved for NASAL specimens     only), is one component of a     comprehensive MRSA colonization     surveillance program. It is not     intended to diagnose MRSA     infection nor to guide or     monitor treatment for     MRSA infections.  CULTURE, RESPIRATORY (NON-EXPECTORATED)     Status: None   Collection Time     05/24/13  7:38 PM      Result Value Range Status   Specimen Description TRACHEAL ASPIRATE   Final   Special Requests NONE   Final   Gram Stain     Final   Value: ABUNDANT WBC PRESENT, PREDOMINANTLY PMN     NO SQUAMOUS EPITHELIAL CELLS SEEN     MODERATE GRAM NEGATIVE COCCOBACILLI     Performed at [facility]   Culture     Final   Value: MODERATE HAEMOPHILUS INFLUENZAE     Note: BETA LACTAMASE NEGATIVE     Performed at [facility]   Report Status 05/27/2013 FINAL   Final  RESPIRATORY VIRUS PANEL     Status: None   Collection Time    05/24/13  8:02 PM      Result Value Range Status   Source - RVPAN NASAL WASHINGS   Corrected   Comment: CORRECTED ON 12/17 AT 1908: PREVIOUSLY REPORTED AS NASAL WASHINGS   Respiratory Syncytial Virus A NOT DETECTED   Final   Respiratory Syncytial Virus B NOT DETECTED   Final   Influenza A NOT DETECTED  Final   Influenza B NOT DETECTED   Final   Parainfluenza 1 NOT DETECTED   Final   Parainfluenza 2 NOT DETECTED   Final   Parainfluenza 3 NOT DETECTED   Final   Metapneumovirus NOT DETECTED   Final   Rhinovirus NOT DETECTED   Final   Adenovirus NOT DETECTED   Final   Influenza A H1 NOT DETECTED   Final   Influenza A H3 NOT DETECTED   Final   Comment: (NOTE)           Normal Reference Range for each Analyte: NOT DETECTED     Testing performed using the Luminex xTAG Respiratory Viral Panel test     kit.     This test was developed and its performance characteristics determined     by [facility]. It has not been cleared or approved by the [place]     Food and Drug Administration. This test is used for clinical purposes.     It should not be regarded as investigational or for research. This     laboratory is certified under the Clinical Laboratory Improvement     Amendments of 1988 (CLIA) as qualified to perform high complexity     clinical laboratory testing.     Performed at [facility]  CULTURE, BLOOD (ROUTINE X 2)      Status: None   Collection Time    05/24/13  8:09 PM      Result Value Range Status   Specimen Description BLOOD RIGHT ARM   Final   Special Requests BOTTLES DRAWN AEROBIC AND ANAEROBIC 6CC BOTTLES   Final   Culture NO GROWTH 2 DAYS   Final   Report Status PENDING   Incomplete  CULTURE, BLOOD (ROUTINE X 2)     Status: None   Collection Time    05/24/13  8:09 PM      Result Value Range Status   Specimen Description BLOOD RIGHT HAND   Final   Special Requests BOTTLES DRAWN AEROBIC AND ANAEROBIC 6CC BOTTLES   Final   Culture NO GROWTH 2 DAYS   Final   Report Status PENDING   Incomplete   Medical History: Past Medical History  Diagnosis Date  . Hypertension   . COPD (chronic obstructive pulmonary disease)   . Oxygen dependent   . Hypoxia   . Peptic ulcer   . DDD (degenerative disc disease)   . Osteoporosis   . Osteoarthritis   . Chronic pain syndrome   . Back pain   . Hip pain, bilateral   . Wrist pain   . Anxiety    Medications:  Scheduled:  . acetylcysteine  3 mL Nebulization Q4H  . albuterol  2.5 mg Nebulization Q4H   And  . ipratropium  0.5 mg Nebulization Q4H  . antiseptic oral rinse  15 mL Mouth Rinse QID  . azithromycin  500 mg Intravenous Q24H  . chlorhexidine  15 mL Mouth Rinse BID  . enoxaparin (LOVENOX) injection  40 mg Subcutaneous Q24H  . guaiFENesin  1,200 mg Oral BID  . metoprolol tartrate  50 mg Oral BID  . pantoprazole (PROTONIX) IV  40 mg Intravenous QHS   Assessment: 67yo female admitted with pneumonia after being without home O2 for ~ 1 week.  Cx + H. Influenza and antibiotics de-escalated to Zithromax.   Patient was extubated earlier this week & continues to improve clinically. Zithromax dose not require renal dose adjustments.   Zithromax 12/17 >>  Zosyn 12/15 >>12/19 Vancomycin 12/15 >>12/19  Goal of Therapy:  Vancomycin trough level 15-20 mcg/ml  Plan:  Continue Zithromax 500mg  IV daily - change to po once clinically  appropriate. Duration of therapy per MD- recommend 7 days. Pharmacy to sign off.  Please re-consult as needed.  Change Protonix to po per P&T policy.  Elson Clan 05/28/2013,11:51 AM

## 2013-05-28 NOTE — Progress Notes (Signed)
Subjective: She remains off the ventilator. She says she still short of breath.  Objective: Vital signs in last 24 hours: Temp:  [97.6 F (36.4 C)-97.9 F (36.6 C)] 97.8 F (36.6 C) (12/19 0600) Pulse Rate:  [47-125] 86 (12/18 2117) Resp:  [20-28] 22 (12/18 2000) BP: (99-162)/(59-78) 111/67 mmHg (12/19 0600) SpO2:  [9 %-100 %] 100 % (12/19 0600) Weight:  [53.8 kg (118 lb 9.7 oz)] 53.8 kg (118 lb 9.7 oz) (12/19 0600) Weight change: 0.2 kg (7.1 oz) Last BM Date:  (unsure)  Intake/Output from previous day: 12/18 0701 - 12/19 0700 In: 1880 [P.O.:1030; IV Piggyback:850] Out: 1500 [Urine:1500]  PHYSICAL EXAM General appearance: alert, appears older than stated age and Chronically sick in appearance Resp: rhonchi bilaterally Cardio: regular rate and rhythm, S1, S2 normal, no murmur, click, rub or gallop GI: soft, non-tender; bowel sounds normal; no masses,  no organomegaly Extremities: extremities normal, atraumatic, no cyanosis or edema  Lab Results:    Basic Metabolic Panel:  Recent Labs  16/10/96 0415 05/28/13 0427  NA 138 138  K 3.1* 3.0*  CL 96 96  CO2 30 33*  GLUCOSE 95 121*  BUN 19 12  CREATININE 1.20* 1.01  CALCIUM 8.5 8.5   Liver Function Tests: No results found for this basename: AST, ALT, ALKPHOS, BILITOT, PROT, ALBUMIN,  in the last 72 hours No results found for this basename: LIPASE, AMYLASE,  in the last 72 hours No results found for this basename: AMMONIA,  in the last 72 hours CBC:  Recent Labs  05/28/13 0427  WBC 10.2  HGB 10.6*  HCT 33.2*  MCV 96.0  PLT 177   Cardiac Enzymes: No results found for this basename: CKTOTAL, CKMB, CKMBINDEX, TROPONINI,  in the last 72 hours BNP: No results found for this basename: PROBNP,  in the last 72 hours D-Dimer: No results found for this basename: DDIMER,  in the last 72 hours CBG:  Recent Labs  05/26/13 0734  GLUCAP 106*   Hemoglobin A1C: No results found for this basename: HGBA1C,  in the  last 72 hours Fasting Lipid Panel: No results found for this basename: CHOL, HDL, LDLCALC, TRIG, CHOLHDL, LDLDIRECT,  in the last 72 hours Thyroid Function Tests: No results found for this basename: TSH, T4TOTAL, FREET4, T3FREE, THYROIDAB,  in the last 72 hours Anemia Panel: No results found for this basename: VITAMINB12, FOLATE, FERRITIN, TIBC, IRON, RETICCTPCT,  in the last 72 hours Coagulation: No results found for this basename: LABPROT, INR,  in the last 72 hours Urine Drug Screen: Drugs of Abuse     Component Value Date/Time   LABOPIA NONE DETECTED 05/24/2013 1428   COCAINSCRNUR NONE DETECTED 05/24/2013 1428   LABBENZ POSITIVE* 05/24/2013 1428   AMPHETMU NONE DETECTED 05/24/2013 1428   THCU NONE DETECTED 05/24/2013 1428   LABBARB NONE DETECTED 05/24/2013 1428    Alcohol Level: No results found for this basename: ETH,  in the last 72 hours Urinalysis: No results found for this basename: COLORURINE, APPERANCEUR, LABSPEC, PHURINE, GLUCOSEU, HGBUR, BILIRUBINUR, KETONESUR, PROTEINUR, UROBILINOGEN, NITRITE, LEUKOCYTESUR,  in the last 72 hours Misc. Labs:  ABGS  Recent Labs  05/26/13 0855  PHART 7.319*  PO2ART 83.5  TCO2 29.7  HCO3 31.9*   CULTURES Recent Results (from the past 240 hour(s))  MRSA PCR SCREENING     Status: None   Collection Time    05/24/13  6:41 PM      Result Value Range Status   MRSA by PCR NEGATIVE  NEGATIVE Final   Comment:            The GeneXpert MRSA Assay (FDA     approved for NASAL specimens     only), is one component of a     comprehensive MRSA colonization     surveillance program. It is not     intended to diagnose MRSA     infection nor to guide or     monitor treatment for     MRSA infections.  CULTURE, RESPIRATORY (NON-EXPECTORATED)     Status: None   Collection Time    05/24/13  7:38 PM      Result Value Range Status   Specimen Description TRACHEAL ASPIRATE   Final   Special Requests NONE   Final   Gram Stain     Final    Value: ABUNDANT WBC PRESENT, PREDOMINANTLY PMN     NO SQUAMOUS EPITHELIAL CELLS SEEN     MODERATE GRAM NEGATIVE COCCOBACILLI     Performed at [facility]   Culture     Final   Value: MODERATE HAEMOPHILUS INFLUENZAE     Note: BETA LACTAMASE NEGATIVE     Performed at [facility]   Report Status 05/27/2013 FINAL   Final  RESPIRATORY VIRUS PANEL     Status: None   Collection Time    05/24/13  8:02 PM      Result Value Range Status   Source - RVPAN NASAL WASHINGS   Corrected   Comment: CORRECTED ON 12/17 AT 1908: PREVIOUSLY REPORTED AS NASAL WASHINGS   Respiratory Syncytial Virus A NOT DETECTED   Final   Respiratory Syncytial Virus B NOT DETECTED   Final   Influenza A NOT DETECTED   Final   Influenza B NOT DETECTED   Final   Parainfluenza 1 NOT DETECTED   Final   Parainfluenza 2 NOT DETECTED   Final   Parainfluenza 3 NOT DETECTED   Final   Metapneumovirus NOT DETECTED   Final   Rhinovirus NOT DETECTED   Final   Adenovirus NOT DETECTED   Final   Influenza A H1 NOT DETECTED   Final   Influenza A H3 NOT DETECTED   Final   Comment: (NOTE)           Normal Reference Range for each Analyte: NOT DETECTED     Testing performed using the Luminex xTAG Respiratory Viral Panel test     kit.     This test was developed and its performance characteristics determined     by [facility]. It has not been cleared or approved by the [place]     Food and Drug Administration. This test is used for clinical purposes.     It should not be regarded as investigational or for research. This     laboratory is certified under the Clinical Laboratory Improvement     Amendments of 1988 (CLIA) as qualified to perform high complexity     clinical laboratory testing.     Performed at [facility]  CULTURE, BLOOD (ROUTINE X 2)     Status: None   Collection Time    05/24/13  8:09 PM      Result Value Range Status   Specimen Description BLOOD RIGHT ARM   Final   Special Requests  BOTTLES DRAWN AEROBIC AND ANAEROBIC 6CC BOTTLES   Final   Culture NO GROWTH 2 DAYS   Final   Report Status PENDING   Incomplete  CULTURE, BLOOD (ROUTINE X 2)     Status: None   Collection Time    05/24/13  8:09 PM      Result Value Range Status   Specimen Description BLOOD RIGHT HAND   Final   Special Requests BOTTLES DRAWN AEROBIC AND ANAEROBIC 6CC BOTTLES   Final   Culture NO GROWTH 2 DAYS   Final   Report Status PENDING   Incomplete   Studies/Results: No results found.  Medications:  Scheduled: . acetylcysteine  3 mL Nebulization Q4H  . albuterol  2.5 mg Nebulization Q4H   And  . ipratropium  0.5 mg Nebulization Q4H  . antiseptic oral rinse  15 mL Mouth Rinse QID  . azithromycin  500 mg Intravenous Q24H  . chlorhexidine  15 mL Mouth Rinse BID  . enoxaparin (LOVENOX) injection  40 mg Subcutaneous Q24H  . guaiFENesin  1,200 mg Oral BID  . influenza vac split quadrivalent PF  0.5 mL Intramuscular Tomorrow-1000  . metoprolol tartrate  50 mg Oral BID  . pantoprazole (PROTONIX) IV  40 mg Intravenous QHS  . piperacillin-tazobactam (ZOSYN)  IV  3.375 g Intravenous Q8H  . pneumococcal 23 valent vaccine  0.5 mL Intramuscular Tomorrow-1000  . vancomycin  1,000 mg Intravenous Q24H   Continuous:  FAO:ZHYQMVHQIONGE, acetaminophen, albuterol, ondansetron (ZOFRAN) IV  Assesment: She has acute on chronic respiratory failure due to COPD exacerbation and pneumonia. I think she has some element of CHF as well. She initially was on mechanical ventilation but was able to be extubated and has remained off the ventilator. It has been recommended that she needs skilled care facility placement at discharge and I think that is appropriate Principal Problem:   Respiratory failure, acute-on-chronic Active Problems:   Dyspnea   Hypertension   COPD (chronic obstructive pulmonary disease)   Chronic pain syndrome   Anxiety   Oxygen dependent   COPD exacerbation   PNA (pneumonia)   UTI (urinary  tract infection)    Plan: Continue current medications treatments    LOS: 4 days   Brittney West L 05/28/2013, 8:24 AM

## 2013-05-28 NOTE — Progress Notes (Signed)
PT Cancellation Note  Patient Details Name: Brittney West MRN: [id number] DOB: Nov 13, 1945   Cancelled Treatment:    Reason Eval/Treat Not Completed: Patient declined, no reason specified Pt states:  "I feel like crap".  Unable to participate in PT.  Myrlene Broker L 05/28/2013, 10:40 AM

## 2013-05-28 NOTE — Progress Notes (Signed)
Pt sitting on side of bed calling for breathing Tx, Does not appear in resp distress SAT's on 3lpm/[place] 96

## 2013-05-29 LAB — CBC
HCT: 36.3 % (ref 36.0–46.0)
MCH: 30.8 pg (ref 26.0–34.0)
MCV: 96.3 fL (ref 78.0–100.0)
RBC: 3.77 MIL/uL — ABNORMAL LOW (ref 3.87–5.11)
RDW: 14.3 % (ref 11.5–15.5)
WBC: 14.2 10*3/uL — ABNORMAL HIGH (ref 4.0–10.5)

## 2013-05-29 LAB — BASIC METABOLIC PANEL
BUN: 8 mg/dL (ref 6–23)
CO2: 37 mEq/L — ABNORMAL HIGH (ref 19–32)
Chloride: 94 mEq/L — ABNORMAL LOW (ref 96–112)
Creatinine, Ser: 0.73 mg/dL (ref 0.50–1.10)
GFR calc Af Amer: >90 mL/min (ref >90)
Glucose, Bld: 126 mg/dL — ABNORMAL HIGH (ref 70–99)
Potassium: 3.1 mEq/L — ABNORMAL LOW (ref 3.5–5.1)

## 2013-05-29 MED ORDER — POTASSIUM CHLORIDE CRYS ER 20 MEQ PO TBCR
40.0000 meq | EXTENDED_RELEASE_TABLET | Freq: Once | ORAL | Status: AC
  Administered 2013-05-29: 40 meq via ORAL

## 2013-05-29 MED ORDER — HYDROCODONE-ACETAMINOPHEN 10-325 MG PO TABS
1.0000 | ORAL_TABLET | Freq: Four times a day (QID) | ORAL | Status: DC | PRN
  Administered 2013-05-29 – 2013-05-31 (×6): 1 via ORAL
  Filled 2013-05-29 (×6): qty 1

## 2013-05-29 NOTE — Progress Notes (Signed)
[facility] PROGRESS NOTE  Brittney West UJW:[id number] DOB: June 27, 1945 DOA: 05/24/2013 PCP: Ernestine Conrad, MD  Summary: 67 year old woman with chronic respiratory failure oxygen dependent secondary to COPD presented to the emergency department with shortness of breath. Found to be in respiratory distress, ABG revealed respiratory acidosis and patient was intubated in the emergency department. Her husband died a few weeks ago she was evicted from her apartment and has not been able to take her oxygen with her. Off oxygen for approximately one week.  She was successfully extubated and continues on antibiotics for presumed community acquired pneumonia and nasal cannula oxygen for chronic respiratory failure. Anticipate discharge within the next 3-4 days  Assessment/Plan: 1. Acute on chronic hypoxic respiratory failure, required intubation and mechanical ventilation on admission, subsequently extubated 12/17 and currently stable on nasal cannula. 2. Suspected community-acquired pneumonia, now afebrile 24 hours; blood cultures have shown no growth, and sputum cultures are positive for H. Influenza.  Will discontinue vancomycin and zosyn, continue azithromycin. Encouraged pulmonary hygiene 3. COPD without evidence of exacerbation, stable 4. Acute encephalopathy, resolved; secondary to pneumonia and respiratory failure 5. Acute renal insufficiency secondary to diuresis, resolved 6. Acute diastolic heart failure, now resolved status post diuresis 7. Vomiting, diarrhea; significance unclear. Resolved. Suspect secondary to acute illness 8. Positive troponin, repeat troponins negative. Secondary to strain. No further evaluation suggested. Echocardiogram reassuring. 9. Chronic pain syndrome, stable 10. Anxiety, stable 11. Social: Per chart: Patient's husband died a few weeks ago and she has subsequently been living with a cousin without her usual oxygen--although she denies being on oxygen  Code  Status: full code DVT prophylaxis: Lovenox Family Communication: None present Disposition Plan: will need SNF placement  Erick Blinks, MD  [facility]  Pager [phone] If 7PM-7AM, please contact night-coverage at [email or web address], password [facility] 05/29/2013, 4:17 PM  LOS: 5 days   Consultants:  Pulmonology  Procedures:  ETT 12/15 >> 12/17  Echocardiogram left ventricular ejection fraction 55-60%. Grade 1 diastolic dysfunction.  Antibiotics:  Zosyn 12/15 >> 12/19  Vancomycin 12/15 >> 12/19  Azithromycin 12/17 >>   HPI/Subjective: " I feel terrible".  Patient continues to have productive cough.  Had worsening shortness of breath overnight.  Objective: Filed Vitals:   05/29/13 0830 05/29/13 0959 05/29/13 1145 05/29/13 1516  BP: 149/61   140/62  Pulse: 90   90  Temp: 98.8 F (37.1 C)   98.2 F (36.8 C)  TempSrc: Oral     Resp: 24   20  Height:      Weight:      SpO2: 94% 96% 95% 99%    Intake/Output Summary (Last 24 hours) at 05/29/13 1617 Last data filed at 05/29/13 6578  Gross per 24 hour  Intake    370 ml  Output      0 ml  Net    370 ml     Filed Weights   05/27/13 0500 05/28/13 0600 05/28/13 1712  Weight: 53.6 kg (118 lb 2.7 oz) 53.8 kg (118 lb 9.7 oz) 54.795 kg (120 lb 12.8 oz)    Exam:   Afebrile, vital signs stable. Hypoxia stable on 4 L   Gen: NAD  Chest: coarse breath sounds bilaterally  Cardiac: s1., S2 RRR  Abdomen: soft, nt, nd, bs+  EXT: no edema b/l  Scheduled Meds: . acetylcysteine  3 mL Nebulization Q4H  . albuterol  2.5 mg Nebulization Q4H   And  . ipratropium  0.5 mg Nebulization Q4H  . antiseptic oral rinse  15 mL Mouth Rinse QID  . azithromycin  500 mg Intravenous Q24H  . chlorhexidine  15 mL Mouth Rinse BID  . enoxaparin (LOVENOX) injection  40 mg Subcutaneous Q24H  . feeding supplement (ENSURE COMPLETE)  237 mL Oral BID BM  . guaiFENesin  1,200 mg Oral BID  . metoprolol tartrate  50 mg Oral BID  .  pantoprazole  40 mg Oral Daily  . potassium chloride  40 mEq Oral Once   Continuous Infusions:    Principal Problem:   Respiratory failure, acute-on-chronic Active Problems:   Dyspnea   Hypertension   COPD (chronic obstructive pulmonary disease)   Chronic pain syndrome   Anxiety   Oxygen dependent   COPD exacerbation   PNA (pneumonia)   UTI (urinary tract infection)   Time spent 25 minutes

## 2013-05-29 NOTE — Progress Notes (Signed)
Started PT on neb treatment she stopped early complaing of her her ears. Did not include mucomyst for night time neb ( sleeping).

## 2013-05-30 LAB — CBC
MCH: 31 pg (ref 26.0–34.0)
MCHC: 31.7 g/dL (ref 30.0–36.0)
MCV: 97.5 fL (ref 78.0–100.0)
Platelets: 202 10*3/uL (ref 150–400)
RDW: 14.3 % (ref 11.5–15.5)

## 2013-05-30 LAB — BASIC METABOLIC PANEL
BUN: 6 mg/dL (ref 6–23)
CO2: 39 mEq/L — ABNORMAL HIGH (ref 19–32)
Calcium: 8.5 mg/dL (ref 8.4–10.5)
Creatinine, Ser: 0.84 mg/dL (ref 0.50–1.10)
Glucose, Bld: 108 mg/dL — ABNORMAL HIGH (ref 70–99)

## 2013-05-30 MED ORDER — BIOTENE DRY MOUTH MT LIQD
15.0000 mL | Freq: Two times a day (BID) | OROMUCOSAL | Status: DC
  Administered 2013-05-31: 15 mL via OROMUCOSAL

## 2013-05-30 NOTE — Progress Notes (Signed)
[facility] PROGRESS NOTE  Brittney West HQI:[id number] DOB: 08/25/1945 DOA: 05/24/2013 PCP: Ernestine Conrad, MD  Summary: 67 year old woman with chronic respiratory failure oxygen dependent secondary to COPD presented to the emergency department with shortness of breath. Found to be in respiratory distress, ABG revealed respiratory acidosis and patient was intubated in the emergency department. Her husband died a few weeks ago she was evicted from her apartment and has not been able to take her oxygen with her. Off oxygen for approximately one week.  She was successfully extubated and continues on antibiotics for presumed community acquired pneumonia and nasal cannula oxygen for chronic respiratory failure. Anticipate discharge within the next 3-4 days  Assessment/Plan: 1. Acute on chronic hypoxic respiratory failure, required intubation and mechanical ventilation on admission, subsequently extubated 12/17 and currently stable on nasal cannula. 2. Suspected community-acquired pneumonia, now afebrile 24 hours; blood cultures have shown no growth, and sputum cultures are positive for H. Influenza.  Currently on azithromycin with steady improvement. Encouraged pulmonary hygiene 3. COPD without evidence of exacerbation, stable 4. Acute encephalopathy, resolved; secondary to pneumonia and respiratory failure 5. Acute renal insufficiency secondary to diuresis, resolved 6. Acute diastolic heart failure, now resolved status post diuresis 7. Vomiting, diarrhea; significance unclear. Resolved. Suspect secondary to acute illness 8. Positive troponin, repeat troponins negative. Secondary to strain. No further evaluation suggested. Echocardiogram reassuring. 9. Chronic pain syndrome, stable 10. Anxiety, stable 11. Social: Per chart: Patient's husband died a few weeks ago and she has subsequently been living with a cousin without her usual oxygen--although she denies being on oxygen  Code Status: full  code DVT prophylaxis: Lovenox Family Communication: None present Disposition Plan: will need SNF placement, anticipate that she will be ready for discharge on 12/22  Erick Blinks, MD  [facility]  Pager [phone] If 7PM-7AM, please contact night-coverage at [email or web address], password [facility] 05/30/2013, 2:49 PM  LOS: 6 days   Consultants:  Pulmonology  Procedures:  ETT 12/15 >> 12/17  Echocardiogram left ventricular ejection fraction 55-60%. Grade 1 diastolic dysfunction.  Antibiotics:  Zosyn 12/15 >> 12/19  Vancomycin 12/15 >> 12/19  Azithromycin 12/17 >>   HPI/Subjective: Feeling a little better today.  Continue to produce sputum with cough  Objective: Filed Vitals:   05/30/13 0031 05/30/13 0434 05/30/13 0811 05/30/13 1130  BP:      Pulse:      Temp:      TempSrc:      Resp:      Height:      Weight:      SpO2: 98% 96% 95% 93%    Intake/Output Summary (Last 24 hours) at 05/30/13 1449 Last data filed at 05/29/13 2012  Gross per 24 hour  Intake   1020 ml  Output      0 ml  Net   1020 ml     Filed Weights   05/27/13 0500 05/28/13 0600 05/28/13 1712  Weight: 53.6 kg (118 lb 2.7 oz) 53.8 kg (118 lb 9.7 oz) 54.795 kg (120 lb 12.8 oz)    Exam:   Afebrile, vital signs stable. Hypoxia stable on 3 L   Gen: NAD  Chest: coarse breath sounds bilaterally, improving  Cardiac: s1., S2 RRR  Abdomen: soft, nt, nd, bs+  EXT: no edema b/l  Scheduled Meds: . acetylcysteine  3 mL Nebulization Q4H  . albuterol  2.5 mg Nebulization Q4H   And  . ipratropium  0.5 mg Nebulization Q4H  . antiseptic oral rinse  15 mL Mouth Rinse  QID  . azithromycin  500 mg Intravenous Q24H  . enoxaparin (LOVENOX) injection  40 mg Subcutaneous Q24H  . feeding supplement (ENSURE COMPLETE)  237 mL Oral BID BM  . guaiFENesin  1,200 mg Oral BID  . metoprolol tartrate  50 mg Oral BID  . pantoprazole  40 mg Oral Daily   Continuous Infusions:    Principal Problem:    Respiratory failure, acute-on-chronic Active Problems:   Dyspnea   Hypertension   COPD (chronic obstructive pulmonary disease)   Chronic pain syndrome   Anxiety   Oxygen dependent   COPD exacerbation   PNA (pneumonia)   Time spent 25 minutes

## 2013-05-31 ENCOUNTER — Inpatient Hospital Stay
Admission: RE | Admit: 2013-05-31 | Discharge: 2013-06-25 | Disposition: A | Discharge status: Home / Self Care | Payer: Medicare Other | Source: Ambulatory Visit | Attending: Internal Medicine | Admitting: Internal Medicine

## 2013-05-31 DIAGNOSIS — R05 Cough: Principal | ICD-10-CM

## 2013-05-31 LAB — BASIC METABOLIC PANEL
CO2: 37 mEq/L — ABNORMAL HIGH (ref 19–32)
Calcium: 8.5 mg/dL (ref 8.4–10.5)
Creatinine, Ser: 0.85 mg/dL (ref 0.50–1.10)
GFR calc Af Amer: 82 mL/min — ABNORMAL LOW (ref >90)
GFR calc non Af Amer: 70 mL/min — ABNORMAL LOW (ref >90)
Potassium: 4 mEq/L (ref 3.5–5.1)

## 2013-05-31 LAB — CULTURE, BLOOD (ROUTINE X 2)
Culture: NO GROWTH
Culture: NO GROWTH

## 2013-05-31 MED ORDER — AZITHROMYCIN 250 MG PO TABS: 500.0000 mg | ORAL_TABLET | Freq: Every day | ORAL | Status: DC

## 2013-05-31 MED ORDER — ALBUTEROL SULFATE (2.5 MG/3ML) 0.083% IN NEBU: 2.5000 mg | INHALATION_SOLUTION | Freq: Four times a day (QID) | RESPIRATORY_TRACT | Status: AC | PRN

## 2013-05-31 MED ORDER — DIAZEPAM 10 MG PO TABS: 5.0000 mg | ORAL_TABLET | Freq: Two times a day (BID) | ORAL | Status: AC | PRN

## 2013-05-31 MED ORDER — AZITHROMYCIN 250 MG PO TABS: ORAL_TABLET | ORAL | Status: AC

## 2013-05-31 MED ORDER — METOPROLOL TARTRATE 50 MG PO TABS: 50.0000 mg | ORAL_TABLET | Freq: Two times a day (BID) | ORAL | Status: AC

## 2013-05-31 MED ORDER — GUAIFENESIN ER 600 MG PO TB12: 1200.0000 mg | ORAL_TABLET | Freq: Two times a day (BID) | ORAL | Status: AC

## 2013-05-31 MED ORDER — HYDROCODONE-ACETAMINOPHEN 10-325 MG PO TABS: 1.0000 | ORAL_TABLET | Freq: Four times a day (QID) | ORAL | Status: DC | PRN

## 2013-05-31 NOTE — Progress Notes (Signed)
Called report to [facility], nurse at [facility] in [place].  Verbalized understanding.  Pt dc'd to facility via nursing staff. [place], Candelaria Stagers 05/31/2013

## 2013-05-31 NOTE — Progress Notes (Signed)
PHARMACIST - PHYSICIAN COMMUNICATION DR:   Memon CONCERNING: Antibiotic IV to Oral Route Change Policy  RECOMMENDATION: This patient is receiving Zithromax by the intravenous route.  Based on criteria approved by the Pharmacy and Therapeutics Committee, the antibiotic(s) is/are being converted to the equivalent oral dose form(s).  DESCRIPTION: These criteria include:  Patient being treated for a respiratory tract infection, urinary tract infection, cellulitis or clostridium difficile associated diarrhea if on metronidazole  The patient is not neutropenic and does not exhibit a GI malabsorption state  The patient is eating (either orally or via tube) and/or has been taking other orally administered medications for a least 24 hours  The patient is improving clinically and has a Tmax < 100.5  If you have questions about this conversion, please contact the Pharmacy Department  [x]  ( [phone] )  [place] []  ( [phone] )  [place]  []  ( [phone] )  [facility] []  ( [phone] )  [facility]   S. Manjot Beumer, PharmD  

## 2013-05-31 NOTE — Progress Notes (Signed)
Physical Therapy Treatment Patient Details Name: Brittney West MRN: [id number] DOB: 1945-09-11 Today's Date: 05/31/2013 Time: 8295-6213 PT Time Calculation (min): 26 min  PT Assessment / Plan / Recommendation  History of Present Illness     PT Comments   Pt improved activity tolerance, able to ambulate for 350 feet with RW and SBA with 2L O2 during gait training, vc-ing to increase stride length and for posture.  Attempted standing balance activities, pt c/o feeling lightheaded so returned to room and diaphragmatic breathing training complete.  O2 sat at 94% following cueing to increase air flow.  Pt left in chair with call bell within reach and chair alarm set.  Follow Up Recommendations        Does the patient have the potential to tolerate intense rehabilitation     Barriers to Discharge        Equipment Recommendations       Recommendations for Other Services    Frequency     Progress towards PT Goals Progress towards PT goals: Progressing toward goals  Plan      Precautions / Restrictions Precautions Precautions: Fall Restrictions Weight Bearing Restrictions: No    Mobility  Transfers Transfers: Sit to Stand;Stand to Sit Sit to Stand: 4: Min guard;With upper extremity assist;From bed;From toilet Stand to Sit: 4: Min guard;To chair/3-in-1;With upper extremity assist;To toilet Ambulation/Gait Ambulation/Gait Assistance: 5: Supervision Ambulation Distance (Feet): 350 Feet Assistive device: Rolling walker Gait Pattern: Trunk flexed;Shuffle Gait velocity: WNL Stairs: No    Exercises     PT Diagnosis:    PT Problem List:   PT Treatment Interventions:     PT Goals (current goals can now be found in the care plan section)    Visit Information  Last PT Received On: 05/31/13    Subjective Data   Pt stated she had to use restroom upon therapist entrance.  Reports no pain currently.     Cognition  Cognition Behavior During Therapy: WFL for tasks  assessed/performed Overall Cognitive Status: Within Functional Limits for tasks assessed    Balance     End of Session PT - End of Session Equipment Utilized During Treatment: Gait belt Activity Tolerance: Patient limited by fatigue Patient left: in chair;with call bell/phone within reach;with chair alarm set Nurse Communication: Mobility status   GP     Juel Burrow 05/31/2013, 1:43 PM

## 2013-05-31 NOTE — Discharge Summary (Signed)
Physician Discharge Summary  Brittney West ZOX:[id number] DOB: 07-Aug-1945 DOA: 05/24/2013  PCP: Ernestine Conrad, MD  Admit date: 05/24/2013 Discharge date: 05/31/2013  Time spent: 40 minutes  Recommendations for Outpatient Follow-up:  1. Patient will be discharged to SNF for continued therapy  Discharge Diagnoses:  Principal Problem:   Respiratory failure, acute-on-chronic Active Problems:   Dyspnea   Hypertension   COPD (chronic obstructive pulmonary disease)   Chronic pain syndrome   Anxiety   Oxygen dependent   COPD exacerbation   PNA (pneumonia), H. Influenza   Discharge Condition: improved  Diet recommendation: low salt  Filed Weights   05/27/13 0500 05/28/13 0600 05/28/13 1712  Weight: 53.6 kg (118 lb 2.7 oz) 53.8 kg (118 lb 9.7 oz) 54.795 kg (120 lb 12.8 oz)    History of present illness:  67 yo female with crf due to copd comes into ED with sob and resp distress. Pt had to be emergently intubated in ED. History obtained from ED and icu staff. Apparently pt recently lost her husband and was evicted from her apartment and was not able to take her oxygen with her. She has been off her oxygen for about a week and is living with her cousin. Upon suctioning in icu were copious amounts of thick productive secretions.    Hospital Course:  This patient was admitted with acute respiratory failure due to community acquired pneumonia.  She developed worsening respiratory distress and required intubation with mechanical ventilation.  She was given antibiotics and steadily improved. She was able to extubate.  Sputum cultures grew H. Influenza.  She has been treated with azithromycin and has steadily improved. She is now on 2L of oxygen.  She has COPD and may require oxygen chronically. She is now breathing comfortably on 2L of oxygen.  She is afebrile.  She is ready for discharge to SNF  Procedures:  ETT 12/15 >> 12/17  Echocardiogram left ventricular ejection fraction 55-60%.  Grade 1 diastolic dysfunction.  Consultations:  Pulmonology, Dr. Juanetta Gosling  Discharge Exam: Filed Vitals:   05/31/13 0635  BP: 159/84  Pulse: 83  Temp: 97.9 F (36.6 C)  Resp: 20    General: NAD Cardiovascular: S1, S2 RRR Respiratory: coarse crackles bilaterally  Discharge Instructions  Discharge Orders   Future Orders Complete By Expires   Diet - low sodium heart healthy  As directed    Increase activity slowly  As directed        Medication List    STOP taking these medications       metoprolol succinate 50 MG 24 hr tablet  Commonly known as:  TOPROL-XL      TAKE these medications       albuterol (2.5 MG/3ML) 0.083% nebulizer solution  Commonly known as:  PROVENTIL  Take 3 mLs (2.5 mg total) by nebulization every 6 (six) hours as needed for wheezing or shortness of breath.     azithromycin 250 MG tablet  Commonly known as:  ZITHROMAX  Take 500mg  po daily for 4 more days  Start taking on:  06/01/2013     COMBIVENT RESPIMAT 20-100 MCG/ACT Aers respimat  Generic drug:  Ipratropium-Albuterol  Inhale 1 puff into the lungs every 4 (four) hours as needed. For shortness of breath/wheezing     diazepam 10 MG tablet  Commonly known as:  VALIUM  Take 0.5 tablets (5 mg total) by mouth every 12 (twelve) hours as needed for anxiety. For anxiety     gabapentin 300 MG capsule  Commonly known as:  NEURONTIN  Take 300 mg by mouth 3 (three) times daily.     guaiFENesin 600 MG 12 hr tablet  Commonly known as:  MUCINEX  Take 2 tablets (1,200 mg total) by mouth 2 (two) times daily.     HYDROcodone-acetaminophen 10-325 MG per tablet  Commonly known as:  NORCO  Take 1 tablet by mouth every 6 (six) hours as needed for moderate pain.     metoprolol 50 MG tablet  Commonly known as:  LOPRESSOR  Take 1 tablet (50 mg total) by mouth 2 (two) times daily.     omeprazole 40 MG capsule  Commonly known as:  PRILOSEC  Take 40 mg by mouth daily.     promethazine 25 MG tablet   Commonly known as:  PHENERGAN  Take 25 mg by mouth every 6 (six) hours as needed for nausea or vomiting.       Allergies  Allergen Reactions  . Advair Diskus [Fluticasone-Salmeterol]     Told by GI not to take.  [name] [Aspirin]     Due to GI problems  . Codeine     GI upset  . Nsaids     tigger migraine  . Other     PO/IV steroids--told by GI not to take.  . Stadol [Butorphanol]     Mental status changes  . Xanax [Alprazolam]     Told by GI not to take      The results of significant diagnostics from this hospitalization (including imaging, microbiology, ancillary and laboratory) are listed below for reference.    Significant Diagnostic Studies: Dg Chest 1v Repeat Same Day  05/24/2013   CLINICAL DATA:  Intubation.  EXAM: CHEST - 1 VIEW SAME DAY  COMPARISON:  05/24/2013.  FINDINGS: Endotracheal tube noted with its tip approximately 3.9 cm above the carina. Previously identified mild pulmonary interstitial prominence is again noted. Small pleural effusions cannot be entirely excluded. These findings suggest interstitial edema. Pneumonitis cannot be excluded. Heart size and pulmonary vascularity is stable. There is mild prominence of the central pulmonary vascularity. No pneumothorax. No acute bony abnormality.  IMPRESSION: 1. Interval intubation with endotracheal tube in good anatomic position.  2. Diffuse pulmonary interstitial prominence remains and is unchanged. Findings are consistent with interstitial pulmonary edema or pneumonitis.   Electronically Signed   By: Maisie Fus  Register   On: 05/24/2013 15:58   Portable Chest Xray In Am  05/25/2013   CLINICAL DATA:  Respiratory failure  EXAM: PORTABLE CHEST - 1 VIEW  COMPARISON:  05/24/2013  FINDINGS: Cardiomediastinal silhouette is stable. Endotracheal tube in place with tip 3.7 cm above the carina. Worsening mild interstitial prominence bilaterally suspicious for pneumonitis or edema. No segmental infiltrate.  IMPRESSION:  Endotracheal tube in place. Worsening interstitial prominence bilaterally suspicious for edema or pneumonitis. No segmental infiltrate.   Electronically Signed   By: Natasha Mead M.D.   On: 05/25/2013 07:35   Dg Chest Port 1 View  05/24/2013   CLINICAL DATA:  Altered mental status, hypoxia.  EXAM: PORTABLE CHEST - 1 VIEW  COMPARISON:  10/2011.  FINDINGS: Trachea is midline. Heart size normal. Mild diffuse interstitial prominence and indistinctness. Probable tiny bilateral effusions.  IMPRESSION: Question mild edema and tiny bilateral effusions.   Electronically Signed   By: Leanna Battles M.D.   On: 05/24/2013 14:00    Microbiology: Recent Results (from the past 240 hour(s))  MRSA PCR SCREENING     Status: None   Collection Time  05/24/13  6:41 PM      Result Value Range Status   MRSA by PCR NEGATIVE  NEGATIVE Final   Comment:            The GeneXpert MRSA Assay (FDA     approved for NASAL specimens     only), is one component of a     comprehensive MRSA colonization     surveillance program. It is not     intended to diagnose MRSA     infection nor to guide or     monitor treatment for     MRSA infections.  CULTURE, RESPIRATORY (NON-EXPECTORATED)     Status: None   Collection Time    05/24/13  7:38 PM      Result Value Range Status   Specimen Description TRACHEAL ASPIRATE   Final   Special Requests NONE   Final   Gram Stain     Final   Value: ABUNDANT WBC PRESENT, PREDOMINANTLY PMN     NO SQUAMOUS EPITHELIAL CELLS SEEN     MODERATE GRAM NEGATIVE COCCOBACILLI     Performed at [facility]   Culture     Final   Value: MODERATE HAEMOPHILUS INFLUENZAE     Note: BETA LACTAMASE NEGATIVE     Performed at [facility]   Report Status 05/27/2013 FINAL   Final  RESPIRATORY VIRUS PANEL     Status: None   Collection Time    05/24/13  8:02 PM      Result Value Range Status   Source - RVPAN NASAL WASHINGS   Corrected   Comment: CORRECTED ON 12/17 AT 1908: PREVIOUSLY  REPORTED AS NASAL WASHINGS   Respiratory Syncytial Virus A NOT DETECTED   Final   Respiratory Syncytial Virus B NOT DETECTED   Final   Influenza A NOT DETECTED   Final   Influenza B NOT DETECTED   Final   Parainfluenza 1 NOT DETECTED   Final   Parainfluenza 2 NOT DETECTED   Final   Parainfluenza 3 NOT DETECTED   Final   Metapneumovirus NOT DETECTED   Final   Rhinovirus NOT DETECTED   Final   Adenovirus NOT DETECTED   Final   Influenza A H1 NOT DETECTED   Final   Influenza A H3 NOT DETECTED   Final   Comment: (NOTE)           Normal Reference Range for each Analyte: NOT DETECTED     Testing performed using the Luminex xTAG Respiratory Viral Panel test     kit.     This test was developed and its performance characteristics determined     by [facility]. It has not been cleared or approved by the [place]     Food and Drug Administration. This test is used for clinical purposes.     It should not be regarded as investigational or for research. This     laboratory is certified under the Clinical Laboratory Improvement     Amendments of 1988 (CLIA) as qualified to perform high complexity     clinical laboratory testing.     Performed at [facility]  CULTURE, BLOOD (ROUTINE X 2)     Status: None   Collection Time    05/24/13  8:09 PM      Result Value Range Status   Specimen Description BLOOD RIGHT ARM   Final   Special Requests BOTTLES DRAWN AEROBIC AND ANAEROBIC 6CC BOTTLES   Final  Culture NO GROWTH 2 DAYS   Final   Report Status PENDING   Incomplete  CULTURE, BLOOD (ROUTINE X 2)     Status: None   Collection Time    05/24/13  8:09 PM      Result Value Range Status   Specimen Description BLOOD RIGHT HAND   Final   Special Requests BOTTLES DRAWN AEROBIC AND ANAEROBIC 6CC BOTTLES   Final   Culture NO GROWTH 2 DAYS   Final   Report Status PENDING   Incomplete     Labs: Basic Metabolic Panel:  Recent Labs Lab 05/27/13 0415 05/28/13 0427 05/29/13 0600  05/30/13 0540 05/31/13 0505  NA 138 138 138 140 140  K 3.1* 3.0* 3.1* 3.8 4.0  CL 96 96 94* 96 96  CO2 30 33* 37* 39* 37*  GLUCOSE 95 121* 126* 108* 94  BUN 19 12 8 6 8   CREATININE 1.20* 1.01 0.73 0.84 0.85  CALCIUM 8.5 8.5 8.7 8.5 8.5   Liver Function Tests: No results found for this basename: AST, ALT, ALKPHOS, BILITOT, PROT, ALBUMIN,  in the last 168 hours No results found for this basename: LIPASE, AMYLASE,  in the last 168 hours No results found for this basename: AMMONIA,  in the last 168 hours CBC:  Recent Labs Lab 05/25/13 0419 05/28/13 0427 05/29/13 0600 05/30/13 0540  WBC 8.6 10.2 14.2* 7.8  NEUTROABS 6.7  --   --   --   HGB 11.1* 10.6* 11.6* 11.3*  HCT 33.5* 33.2* 36.3 35.6*  MCV 95.2 96.0 96.3 97.5  PLT 179 177 222 202   Cardiac Enzymes:  Recent Labs Lab 05/24/13 1616 05/24/13 2009 05/25/13 0142 05/25/13 0802  TROPONINI <0.30 <0.30 <0.30 <0.30   BNP: BNP (last 3 results)  Recent Labs  05/24/13 1245 05/25/13 0802  PROBNP 5934.0* 3740.0*   CBG:  Recent Labs Lab 05/24/13 2101 05/25/13 0011 05/26/13 0734  GLUCAP 100* 94 106*       Signed:  Aaronmichael Brumbaugh  [facility] 05/31/2013, 1:20 PM

## 2013-05-31 NOTE — Clinical Social Work Note (Signed)
Patient ready for discharge, will admit to [facility] and transfer w [facility] staff via [facility].  Discharge packet prepared, awaiting discharge summary.  FL2 reviewed w RN and updated as needed.  Patient and facility informed and agreeable to patient transfer to [facility] today.  CSW Reubin Milan will complete discharge process.  [place], LCSW Clinical Social Worker [phone])

## 2013-06-01 ENCOUNTER — Other Ambulatory Visit: Payer: Self-pay | Admitting: *Deleted

## 2013-06-01 MED ORDER — DIAZEPAM 5 MG PO TABS: ORAL_TABLET | ORAL | Status: AC

## 2013-06-01 MED ORDER — HYDROCODONE-ACETAMINOPHEN 10-325 MG PO TABS: 1.0000 | ORAL_TABLET | Freq: Four times a day (QID) | ORAL | Status: DC | PRN

## 2013-06-02 ENCOUNTER — Non-Acute Institutional Stay (SKILLED_NURSING_FACILITY): Payer: Medicare Other | Admitting: Internal Medicine

## 2013-06-02 DIAGNOSIS — J9610 Chronic respiratory failure, unspecified whether with hypoxia or hypercapnia: Secondary | ICD-10-CM

## 2013-06-02 DIAGNOSIS — J14 Pneumonia due to Hemophilus influenzae: Secondary | ICD-10-CM

## 2013-06-02 DIAGNOSIS — J449 Chronic obstructive pulmonary disease, unspecified: Secondary | ICD-10-CM

## 2013-06-07 ENCOUNTER — Ambulatory Visit (HOSPITAL_COMMUNITY)
Admit: 2013-06-07 | Discharge: 2013-06-07 | Disposition: A | Discharge status: Home / Self Care | Payer: Medicare Other | Attending: Internal Medicine | Admitting: Internal Medicine

## 2013-06-14 NOTE — Progress Notes (Addendum)
Patient ID: Brittney West, female   DOB: 12/18/1945, 68 y.o.   MRN: [id number]           HISTORY & PHYSICAL  DATE:  06/02/2013    FACILITY: [facility]    LEVEL OF CARE:   SNF   CHIEF COMPLAINT:  Admission to SNF, status post stay at [facility], 05/24/2013 through 05/31/2013.    HISTORY OF PRESENT ILLNESS:  This is a 68 year-old woman with a history of chronic renal failure and COPD who was admitted acutely to the hospital with shortness of breath and respiratory distress.  She had to be emergently intubated in the emergency department.  She was  apparently prescribed oxygen in the past, although the patient denies this today.  Her history suggests that she had been off oxygen for a week while living with her cousin (recently evicted from her own apartment).  Upon suctioning, she had copious amounts of thick productive secretions, ultimately cultured Haemophilus influenzae.    PAST MEDICAL HISTORY/PROBLEM LIST:  Respiratory failure, acute-on-chronic.    Anxiety.    Chronic pain syndrome.    COPD.  Again, the patient vehemently denies that she had ever been on oxygen.    Hypertension.    Pneumonia, Haemophilus influenzae.    CURRENT MEDICATIONS:  Medication list is reviewed.    Albuterol nebulizers 2.5 q.6 as needed for wheezing or shortness of breath.    Azithromycin 500 mg daily for four more days.     Combivent 20/200, 1 puff into the lungs every four hours as needed.    Valium 5 mg by mouth every 12 hours as needed for anxiety.    Neurontin 300 three times a day.    Guaifenesin 2 tablets, 1200 mg total b.i.d.    Hydrocodone/acetaminophen.    Norco 1 tablet q.6 h p.r.n. moderate pain.    Lopressor 50 mg b.i.d.    Omeprazole 40 mg daily.    Phenergan 25 q.6 h.    ALLERGIES/INTOLERANCES:  Notable allergies include:   ASA.    Codeine.    NSAIDs.    P.o. or IV steroids.    Advair Diskus.    Xanax.    All of these are probably adverse  reactions, however.    SOCIAL HISTORY: HOUSING:  The patient tells me she lives with a cousin in [place].   FUNCTIONAL STATUS:  Does not describe being limited by her COPD prior to this.  She was still able to walk to the grocery store.  Again, she denies being on oxygen.  She states she was on nebulizers at home.    FAMILY HISTORY:  None related by the patient.    REVIEW OF SYSTEMS:   CHEST/RESPIRATORY:  States she does not feel excessively short of breath.  No cough.   CARDIAC:   No chest pain.   GI:  No nausea, vomiting, abdominal pain, or diarrhea.   GU:  No dysuria or flank pain.    PHYSICAL EXAMINATION:   VITAL SIGNS:   O2 SATURATIONS:  Pulse ox is 97% on 2 L.  I had her walk from her bed to the door of her room and back, which she does very well.  However, her pulse ox was 89% after she did this although she did not appear to be in any distress.     HEENT:   MOUTH/THROAT:   No oral lesions.   CHEST/RESPIRATORY:  Decreased air entry bilaterally with expiratory wheezing.  Mild accessory muscle use  with minimal activity.    CARDIOVASCULAR:  CARDIAC:   Heart sounds are normal.  There are no murmurs.  No gallops.  JVP is not elevated.   GASTROINTESTINAL:  LIVER/SPLEEN/KIDNEYS:  No liver, no spleen.  No tenderness.   GENITOURINARY:  BLADDER:   Not distended.  There is no CVA tenderness.   CIRCULATION:   EDEMA/VARICOSITIES:  Extremities:  No edema.   NEUROLOGICAL:    BALANCE/GAIT:  Get-up-and-go test was done well.  I did this without oxygen.  Her gait is stable.     ASSESSMENT/PLAN:  Probable severe COPD.  The patient actually denies being on oxygen.  Lab work from this morning shows an elevated total CO2, probably compensated respiratory acidosis.  Her CBC shows a white count of 9.3, hemoglobin of 11.9.  BUN and creatinine are normal.    Haemophilus influenzae community-acquired pneumonia.  She is completing Azithromycin.    Chronic anxiety.  On Valium.  She certainly seemed  reasonable today.    Hypertension.  We will monitor while she is here.    One of the issues we will have to decide is whether the patient actually will need chronic oxygen.  It is quite possible she will need this

## 2013-06-17 ENCOUNTER — Ambulatory Visit (HOSPITAL_COMMUNITY): Payer: No Typology Code available for payment source | Attending: Internal Medicine

## 2013-06-17 DIAGNOSIS — R059 Cough, unspecified: Secondary | ICD-10-CM | POA: Insufficient documentation

## 2013-06-17 DIAGNOSIS — R05 Cough: Secondary | ICD-10-CM | POA: Insufficient documentation

## 2013-06-18 ENCOUNTER — Other Ambulatory Visit: Payer: Self-pay | Admitting: *Deleted

## 2013-06-18 MED ORDER — HYDROCODONE-ACETAMINOPHEN 10-325 MG PO TABS: 1.0000 | ORAL_TABLET | Freq: Four times a day (QID) | ORAL | Status: AC | PRN

## 2013-06-18 NOTE — Telephone Encounter (Signed)
rx filled per protocol  

## 2013-06-24 ENCOUNTER — Non-Acute Institutional Stay (SKILLED_NURSING_FACILITY): Payer: Medicare Other | Admitting: Internal Medicine

## 2013-06-24 DIAGNOSIS — F411 Generalized anxiety disorder: Secondary | ICD-10-CM

## 2013-06-24 DIAGNOSIS — J449 Chronic obstructive pulmonary disease, unspecified: Secondary | ICD-10-CM

## 2013-06-24 DIAGNOSIS — I10 Essential (primary) hypertension: Secondary | ICD-10-CM

## 2013-06-24 DIAGNOSIS — J189 Pneumonia, unspecified organism: Secondary | ICD-10-CM

## 2013-06-24 DIAGNOSIS — F419 Anxiety disorder, unspecified: Secondary | ICD-10-CM

## 2013-06-24 DIAGNOSIS — G609 Hereditary and idiopathic neuropathy, unspecified: Secondary | ICD-10-CM

## 2013-06-24 NOTE — Progress Notes (Signed)
Patient ID: Brittney West, female   DOB: 09-18-1945, 68 y.o.   MRN: [id number] FACILITY:        [facility]     LEVEL OF CARE:   SNF  This is a discharge note     CHIEF COMPLAINT:  Discharge note     HISTORY OF PRESENT ILLNESS:  This is a 68 year-old woman with a history of chronic renal failure and COPD who was admitted acutely to the hospital with shortness of breath and respiratory distress.  She had to be emergently intubated in the emergency department.  She was  apparently prescribed oxygen in the past,   Her history suggests that she had been off oxygen for a week while living with her cousin (recently evicted from her own apartment).  Upon suctioning, she had copious amounts of thick productive secretions, ultimately cultured Haemophilus influenzae. Her stay here as been quite unremarkable--- earlier this month she did have some increased cough congestion x-ray was suspicious for possible mild acute bronchitis she finished a course of antibiotic and this appears resolved.  She currently is not using oxygen     PAST MEDICAL HISTORY/PROBLEM LIST: Respiratory failure, acute-on-chronic.    Anxiety.    Chronic pain syndrome.    COPD.  Again, the patient  denies that she had ever been on oxygen.    Hypertension.    Pneumonia, Haemophilus influenzae.     CURRENT MEDICATIONS:  Medication list is reviewed.   Albuterol nebulizers 2.5 q.6 as needed for wheezing or shortness of breath.    Azithromycin 500 mg daily --she has completed this.     Combivent 20/200, 1 puff into the lungs every four hours as needed.    Valium 5 mg by mouth every 12 hours as needed for anxiety.    Neurontin 300 three times a day.    Guaifenesin 2 tablets, 1200 mg total b.i.d.    Hydrocodone/acetaminophen.    Norco 1 tablet q.6 h p.r.n. moderate pain.    Lopressor 50 mg b.i.d.    Omeprazole 40 mg daily.    Phenergan 25 q.6 h.     ALLERGIES/INTOLERANCES:  Notable allergies include:   ASA.     Codeine.    NSAIDs.    P.o. or IV steroids.    Advair Diskus.    Xanax.     All of these are probably adverse reactions, however.     SOCIAL HISTORY: HOUSING:  The patient t lives with a cousin in [place].    FUNCTIONAL STATUS:  Does not describe being limited by her COPD prior to this.  She was still able to walk to the grocery store.  Marland Kitchen     FAMILY HISTORY:  None related by the patient.     REVIEW OF SYSTEMS Gen. no complaints of fever or chills.  Skin-does not with any rash or itching.  Head ears nose mouth and throat-does not complaining of any visual changes nasal congestion or sore throat:   CHEST/RESPIRATORY:  States she does not feel excessively short of breath.  No cough.    CARDIAC:   No chest pain.    GI:  No nausea, vomiting, abdominal pain, or diarrhea.    GU:  No dysuria or flank pain Muscle skeletal is ambulating well and does not complaining of any joint pain does have a history of rheumatoid arthritis.  Neurologic no complaints of dizziness or headache.     PHYSICAL EXAMINATION:   VITAL SIGNS--temperature 97.8 pulse 76 respirations  18 blood pressure 100/65-101/62-137/63--- her weight has been stable at around 104.5 pounds Gen.-pleasant elderly female in no distress: Skin no rashes-- warm and dry     .      HEENT:    MOUTH/THROAT:   No oral lesions.    CHEST/RESPIRATORY:  Decreased air entry bilaterally with expiratory wheezing.  Labored breathing.     CARDIOVASCULAR:   CARDIAC:   Heart sounds are normal.  There are no murmurs.  No gallops.  JVP is not elevated.--no edema      GASTROINTESTINAL:   LIVER/SPLEEN/KIDNEYS:  No liver, no spleen.  No tenderness.    GENITOURINARY:   BLADDER:   Not distended.  There is no CVA tenderness.    CIRCULATION:    EDEMA/VARICOSITIES:  Extremities:  No edema.    NEUROLOGICAL:     BALANCE/GAIT:   Her gait is stable Nerves are intact speech is clear no lateralizing findings.  Psych- is alert and oriented  x3  Labs.  06/01/2014.  WBC 9.3 hemoglobin 11.9 platelets 264.  Sodium 139 potassium 4.8 BUN 8 creatinine 0.99.     .      ASSESSMENT/PLAN: Probable severe COPD--- this appears to be quite stable she is receiving albuterol nebulizers she will continue this at home-she also is receiving Mucinex.  --.    Haemophilus influenzae community-acquired pneumonia.  She has completed Azithromycin.    Chronic anxiety.  On Valium. His appears to be stable.    Hypertension. This has been stable blood pressures have been satisfactory during her stay here Neuropathic pain-this appears stable on Neurontin 3 times a day-also takes Norco with good effect   mild bronchitis-she has just finished a course of Levaquin this appears to be stable as well   She will be going home--she is independent and has met  her goals of therapy  [id number]-of note greater than 30 minutes spent preparing this discharge summary

## 2013-06-28 ENCOUNTER — Ambulatory Visit (HOSPITAL_COMMUNITY): Admit: 2013-06-28 | Payer: Medicare Other | Admitting: Physical Therapy

## 2013-07-02 ENCOUNTER — Telehealth (HOSPITAL_COMMUNITY): Payer: Self-pay

## 2013-08-08 DEATH — deceased

## 2015-02-01 LAB — BLOOD GAS, ARTERIAL
Acid-Base Excess: 5.6 mmol/L — ABNORMAL HIGH (ref 0.0–2.0)
Bicarbonate: 29.5 mEq/L — ABNORMAL HIGH (ref 20.0–24.0)
Drawn by: 12971
FIO2: 0.21
O2 Saturation: 92.6 %
Patient temperature: 37
pCO2 arterial: 42.6 mmHg (ref 35.0–45.0)
pH, Arterial: 7.455 — ABNORMAL HIGH (ref 7.350–7.450)
pO2, Arterial: 63 mmHg — ABNORMAL LOW (ref 80.0–100.0)

## 2015-02-25 IMAGING — CR DG CHEST 1V PORT
1 series · 1 of 1 positions shown · non-contrast
Comparison: [DATE].

CLINICAL DATA: Altered mental status, hypoxia.

EXAM:
PORTABLE CHEST - 1 VIEW

[portable]
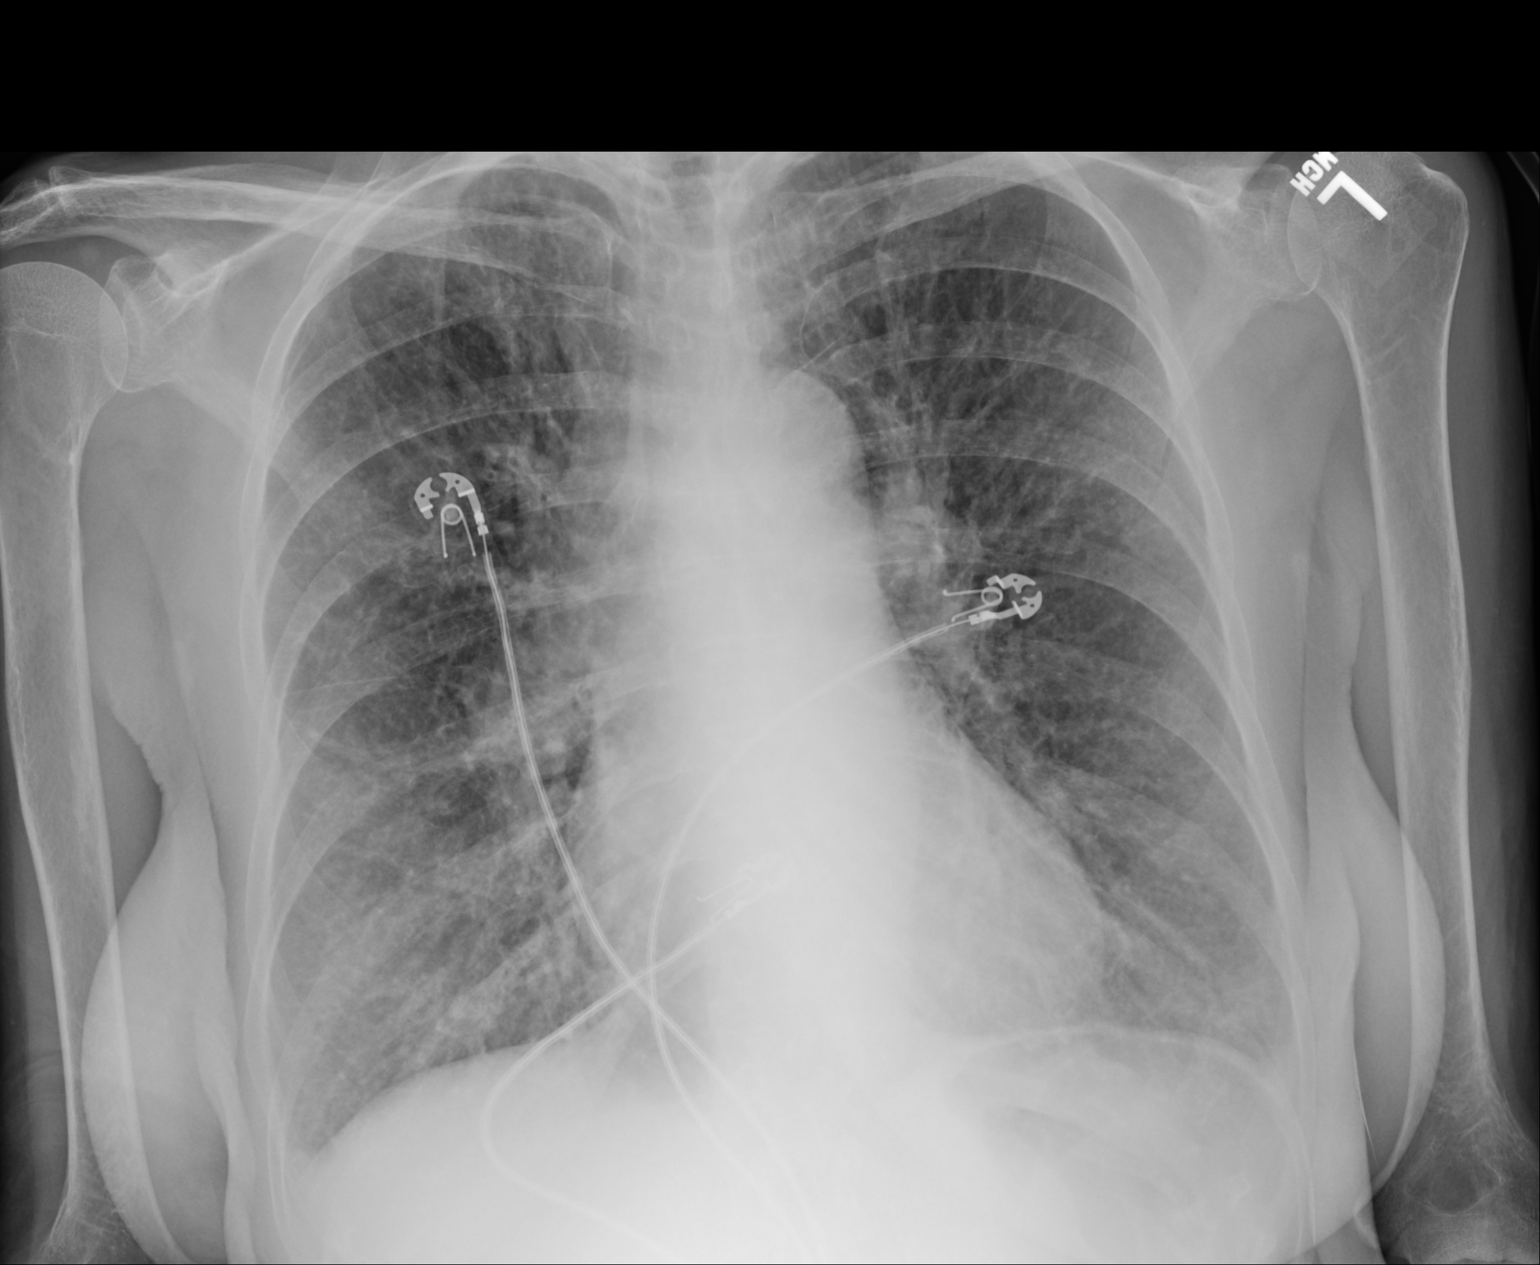

[1 of 1 positions shown; findings below may reference images not displayed]

FINDINGS: Trachea is midline. Heart size normal. Mild diffuse interstitial
prominence and indistinctness. Probable tiny bilateral effusions.
IMPRESSION: Question mild edema and tiny bilateral effusions.

## 2015-02-25 IMAGING — CR DG CHEST 1V SAME DAY
1 series · 1 of 1 positions shown · non-contrast
Comparison: 05/24/2013.

CLINICAL DATA: Intubation.

EXAM:
CHEST - 1 VIEW SAME DAY

[portable]
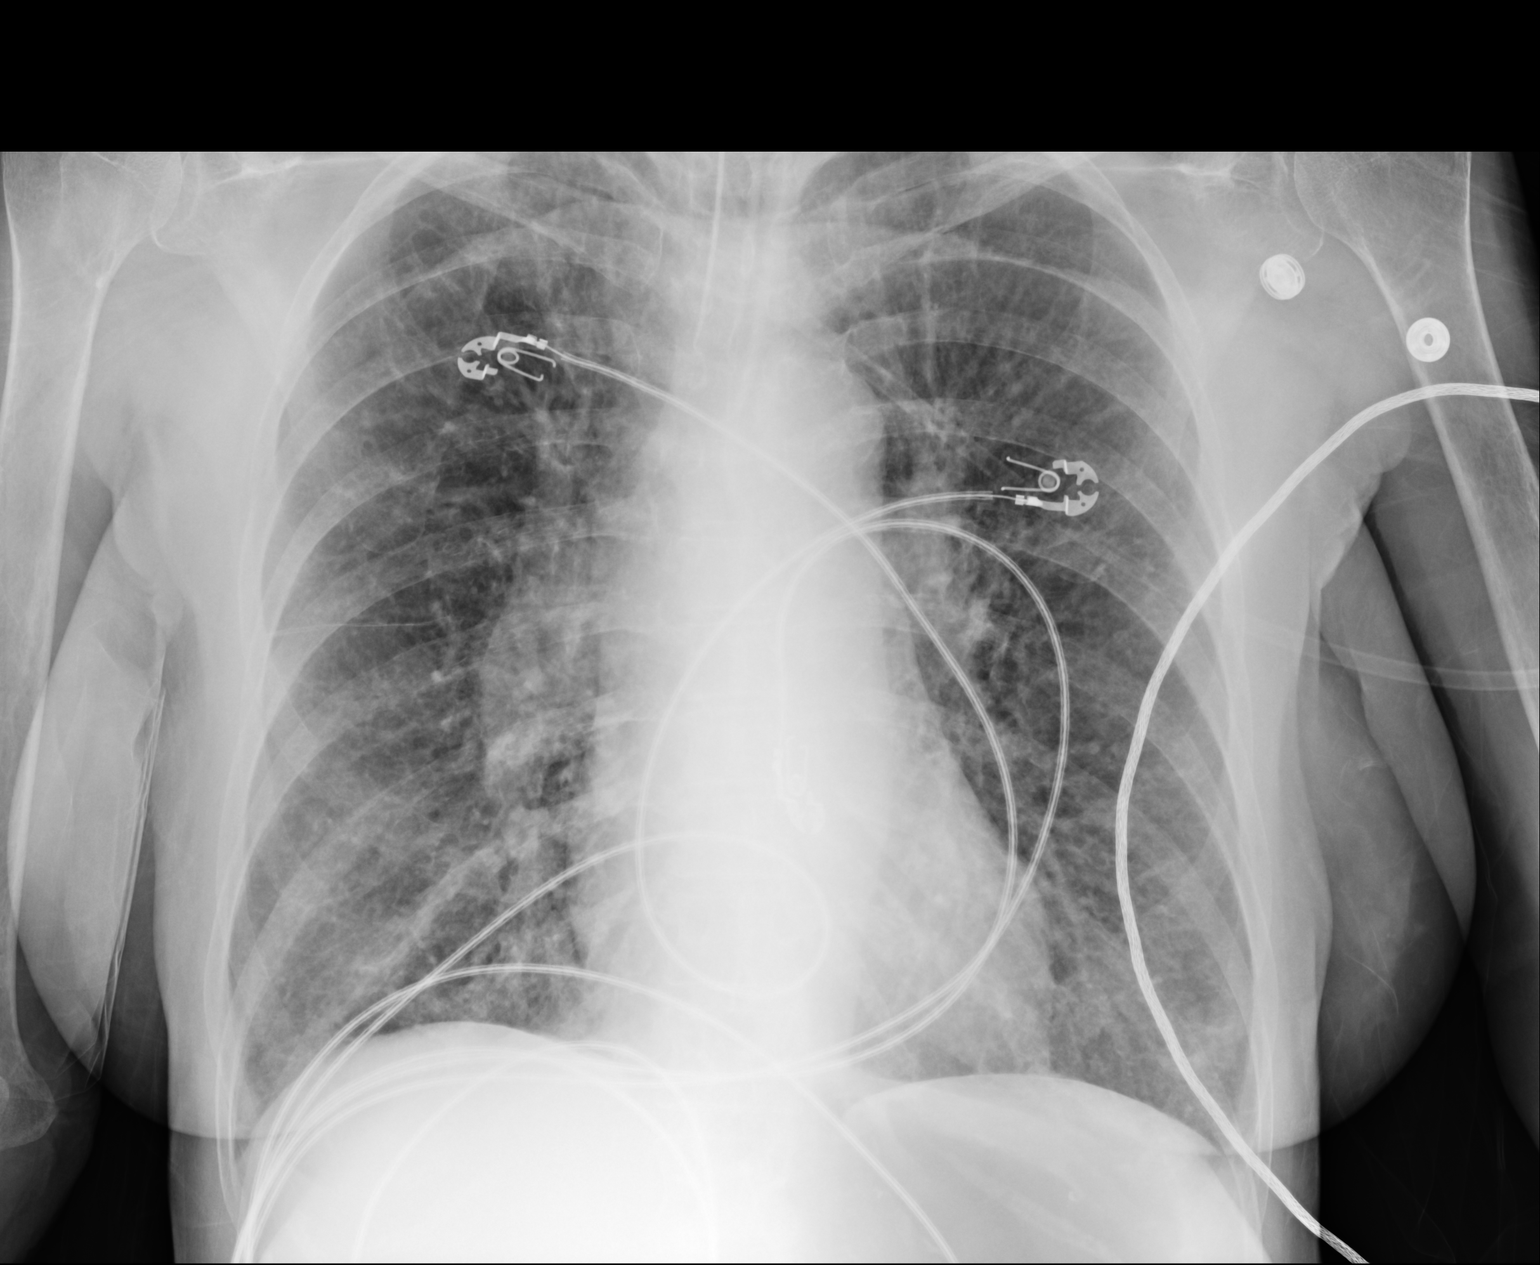

[1 of 1 positions shown; findings below may reference images not displayed]

FINDINGS: Endotracheal tube noted with its tip approximately 3.9 cm above the
carina. Previously identified mild pulmonary interstitial prominence
is again noted. Small pleural effusions cannot be entirely excluded.
These findings suggest interstitial edema. Pneumonitis cannot be
excluded. Heart size and pulmonary vascularity is stable. There is
mild prominence of the central pulmonary vascularity. No
pneumothorax. No acute bony abnormality.
IMPRESSION: 1. Interval intubation with endotracheal tube in good anatomic
position.

2. Diffuse pulmonary interstitial prominence remains and is
unchanged. Findings are consistent with interstitial pulmonary edema
or pneumonitis.

## 2015-03-21 IMAGING — CR DG CHEST 2V
2 series · 2 of 2 positions shown · non-contrast
Comparison: DG CHEST 1V PORT dated 05/25/2013; DG CHEST [id number] DAY
dated 05/24/2013; DG CHEST 1V PORT dated 05/24/2013

CLINICAL DATA: cough

EXAM:
CHEST  2 VIEW

[view not recorded (1 of 2)]
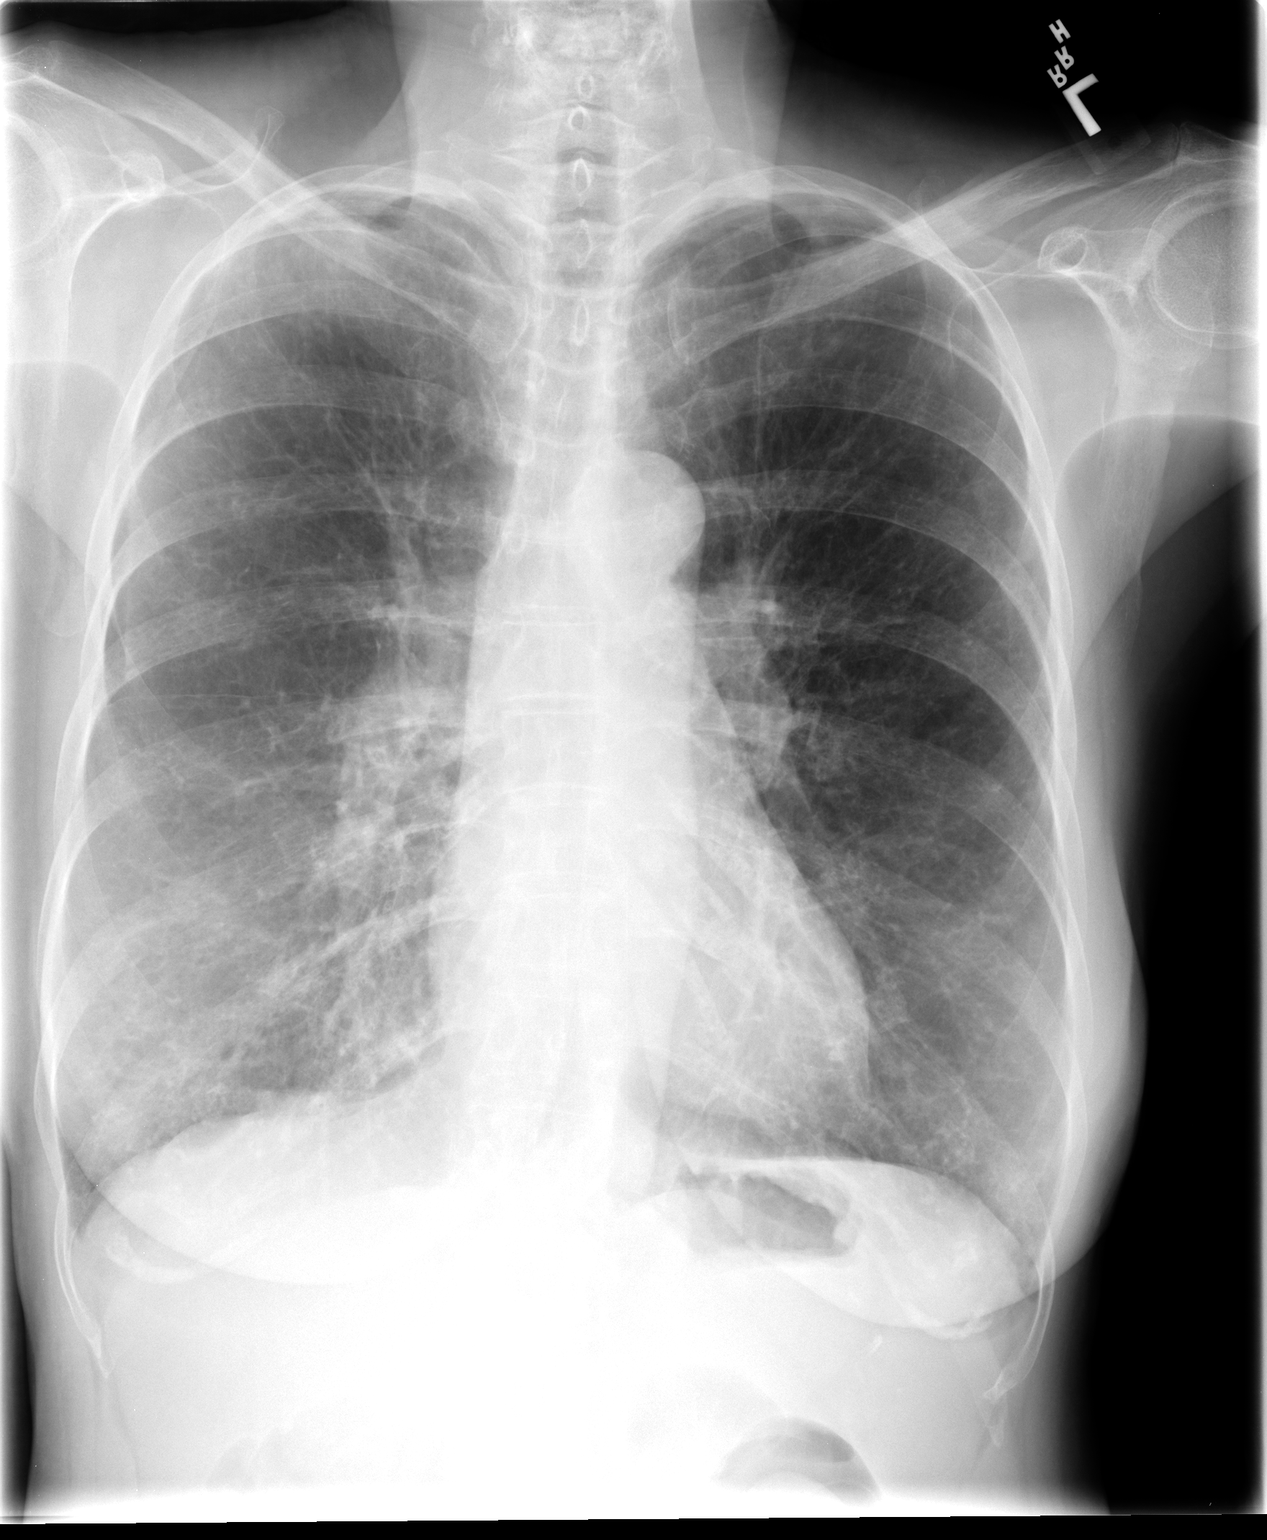

[view not recorded (2 of 2)]
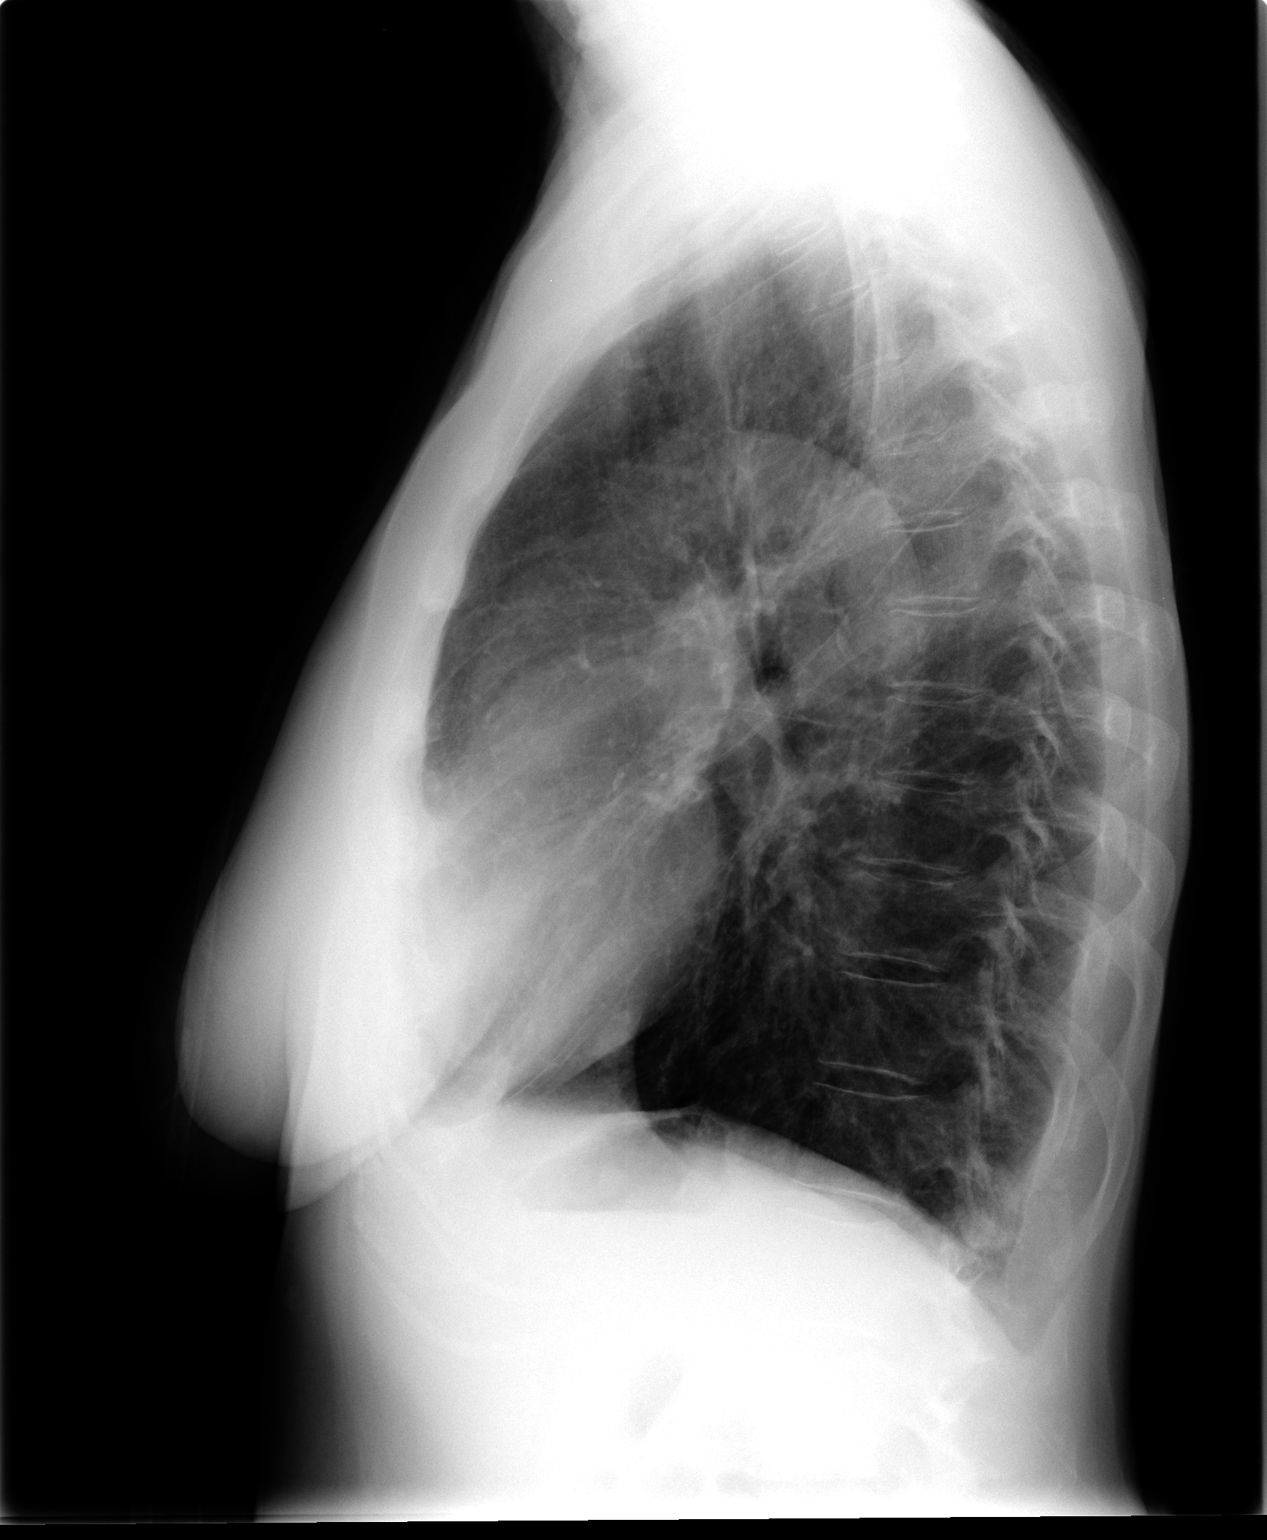

[2 of 2 positions shown; findings below may reference images not displayed]

FINDINGS: Interval removal of the endotracheal tube. The lungs are
hyperinflated likely secondary to COPD. There are mild chronic
bronchitic changes, but superimposed mild acute bronchitis cannot be
excluded. There is no focal parenchymal opacity, pleural effusion,
or pneumothorax. The heart and mediastinal contours are
unremarkable.

The osseous structures are unremarkable.
IMPRESSION: There are mild chronic bronchitic changes, but superimposed mild
acute bronchitis cannot be excluded.
